# Patient Record
Sex: Female | Born: 1965
Health system: Southern US, Community
[De-identification: ages and names within clinical notes are randomized; demographics above are authoritative.]

## PROBLEM LIST (undated history)

## (undated) DIAGNOSIS — K219 Gastro-esophageal reflux disease without esophagitis: Secondary | ICD-10-CM

## (undated) HISTORY — PX: NO PAST SURGERIES: SHX2092

## (undated) HISTORY — DX: Gastro-esophageal reflux disease without esophagitis: K21.9

---

## 2006-06-03 ENCOUNTER — Ambulatory Visit: Payer: Self-pay | Admitting: Internal Medicine

## 2008-01-08 ENCOUNTER — Telehealth: Payer: Self-pay | Admitting: Internal Medicine

## 2008-01-10 ENCOUNTER — Ambulatory Visit: Payer: Self-pay | Admitting: Internal Medicine

## 2008-01-10 DIAGNOSIS — K219 Gastro-esophageal reflux disease without esophagitis: Secondary | ICD-10-CM | POA: Insufficient documentation

## 2008-01-10 DIAGNOSIS — R1013 Epigastric pain: Secondary | ICD-10-CM | POA: Insufficient documentation

## 2008-01-10 DIAGNOSIS — Z9189 Other specified personal risk factors, not elsewhere classified: Secondary | ICD-10-CM | POA: Insufficient documentation

## 2008-01-26 ENCOUNTER — Telehealth: Payer: Self-pay | Admitting: Internal Medicine

## 2008-02-09 ENCOUNTER — Encounter: Admission: RE | Admit: 2008-02-09 | Discharge: 2008-02-09 | Payer: Self-pay | Admitting: Obstetrics and Gynecology

## 2008-12-02 ENCOUNTER — Telehealth: Payer: Self-pay | Admitting: Internal Medicine

## 2009-02-25 ENCOUNTER — Telehealth: Payer: Self-pay | Admitting: Internal Medicine

## 2009-02-25 ENCOUNTER — Ambulatory Visit: Payer: Self-pay | Admitting: Internal Medicine

## 2009-02-25 DIAGNOSIS — J029 Acute pharyngitis, unspecified: Secondary | ICD-10-CM | POA: Insufficient documentation

## 2009-03-13 ENCOUNTER — Encounter: Admission: RE | Admit: 2009-03-13 | Discharge: 2009-03-13 | Payer: Self-pay | Admitting: Obstetrics and Gynecology

## 2009-10-28 IMAGING — MG MM SCREEN MAMMOGRAM BILATERAL
5 series · 5 of 5 positions shown · non-contrast
Comparison: none

DG SCREEN MAMMOGRAM BILATERAL
Bilateral CC and MLO view(s) were taken.

DIGITAL SCREENING MAMMOGRAM WITH CAD:
The breast tissue is heterogeneously dense.  No masses or malignant type calcifications are 
identified.  Compared with prior studies.

[R CC]
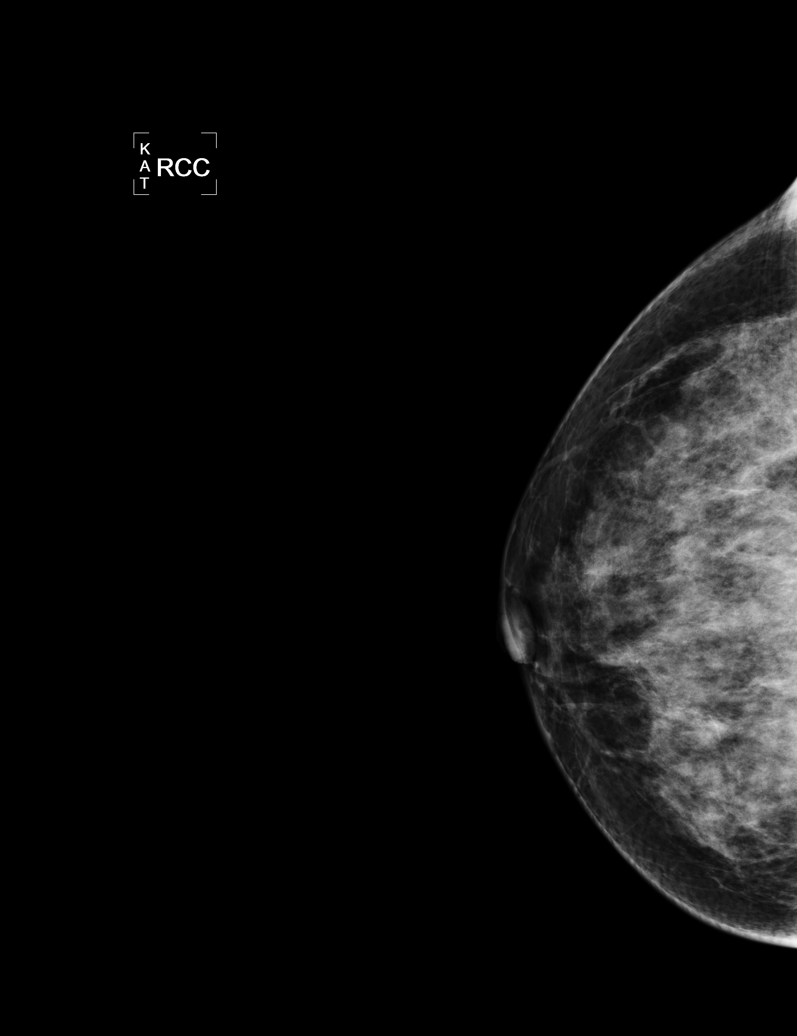

[L CC]
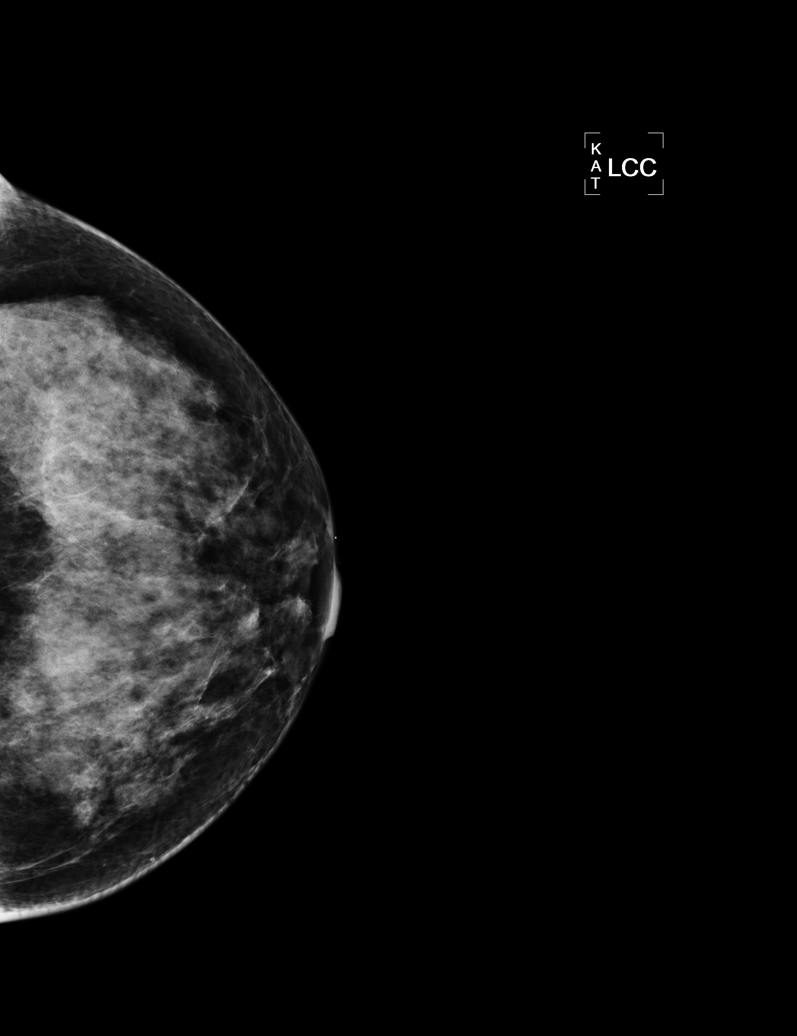

[L MLO]
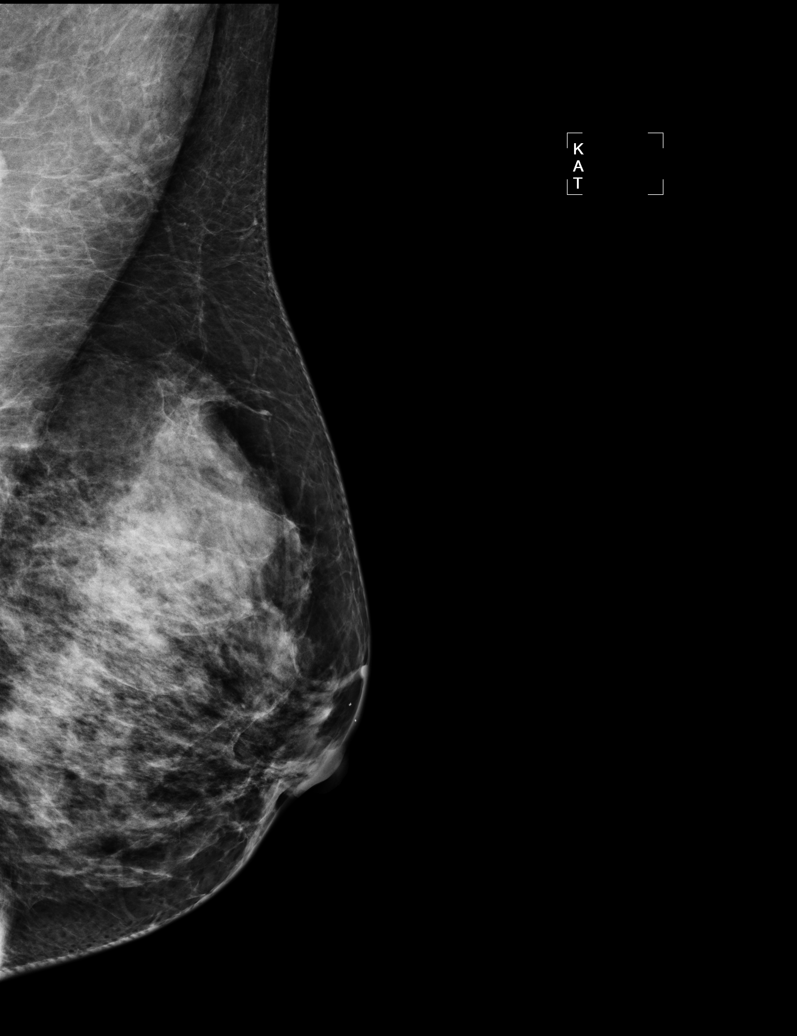

[R MLO (1 of 2)]
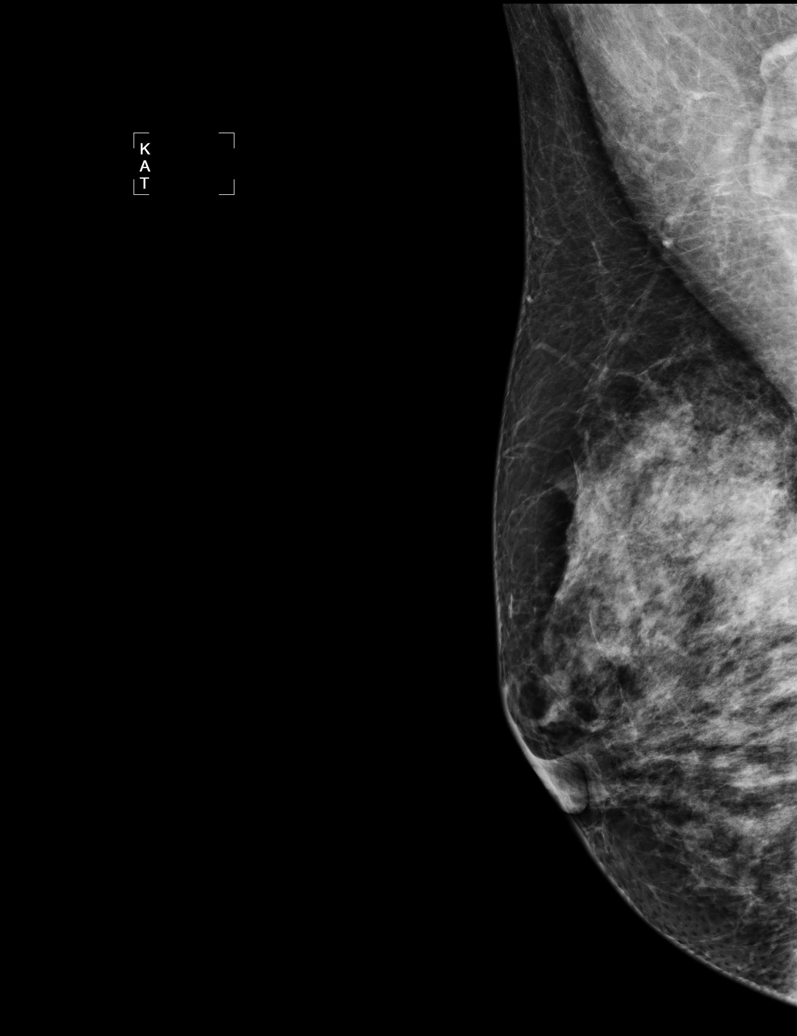

[R MLO (2 of 2)]
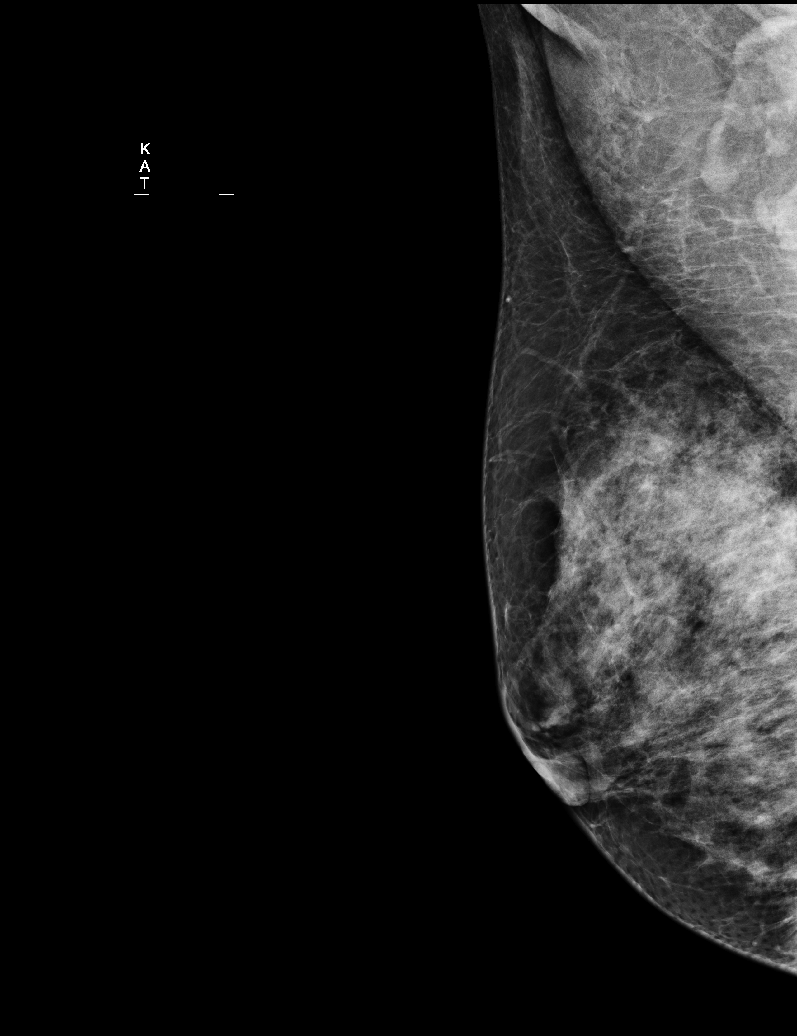

[5 of 5 positions shown; findings below may reference images not displayed]

IMPRESSION: No specific mammographic evidence of malignancy.  Next screening mammogram is recommended in one 
year.

A result letter of this screening mammogram will be mailed directly to the patient.

ASSESSMENT: Negative - BI-RADS 1

Screening mammogram in 1 year.
ANALYZED BY COMPUTER AIDED DETECTION. , THIS PROCEDURE WAS A DIGITAL MAMMOGRAM.

## 2010-04-06 ENCOUNTER — Encounter: Admission: RE | Admit: 2010-04-06 | Discharge: 2010-04-06 | Payer: Self-pay | Admitting: Obstetrics and Gynecology

## 2010-04-06 LAB — HM MAMMOGRAPHY

## 2010-12-08 ENCOUNTER — Telehealth (INDEPENDENT_AMBULATORY_CARE_PROVIDER_SITE_OTHER): Payer: Self-pay | Admitting: *Deleted

## 2011-01-07 NOTE — Progress Notes (Signed)
Summary: Request to be Worked in (cough and congestion)-please triage  Phone Note Call from Patient Call back at Work Phone [phone]   Summary of Call: cough and congestion x 3wks wants to be worked in today, if not today some time this week.(only sda available remainder of the week)  Initial call taken by: Trixie Dredge,  December 08, 2010 8:22 AM  Follow-up for Phone Call        [facility] Follow-up by: [facility] CMA AAMA,  December 08, 2010 1:55 PM  Additional Follow-up for Phone Call Additional follow up Details #1::        Phone not working. Additional Follow-up by: Lynann Beaver CMA AAMA,  December 09, 2010 1:49 PM

## 2011-03-18 ENCOUNTER — Other Ambulatory Visit: Payer: Self-pay | Admitting: Obstetrics and Gynecology

## 2011-03-18 DIAGNOSIS — Z1231 Encounter for screening mammogram for malignant neoplasm of breast: Secondary | ICD-10-CM

## 2011-04-20 ENCOUNTER — Ambulatory Visit
Admission: RE | Admit: 2011-04-20 | Discharge: 2011-04-20 | Disposition: A | Discharge status: Home / Self Care | Payer: Self-pay | Source: Ambulatory Visit | Attending: Obstetrics and Gynecology | Admitting: Obstetrics and Gynecology

## 2011-04-20 DIAGNOSIS — Z1231 Encounter for screening mammogram for malignant neoplasm of breast: Secondary | ICD-10-CM

## 2011-08-26 ENCOUNTER — Encounter: Payer: Self-pay | Admitting: Internal Medicine

## 2011-08-27 ENCOUNTER — Ambulatory Visit (INDEPENDENT_AMBULATORY_CARE_PROVIDER_SITE_OTHER): Payer: PRIVATE HEALTH INSURANCE | Admitting: Internal Medicine

## 2011-08-27 ENCOUNTER — Encounter: Payer: Self-pay | Admitting: Internal Medicine

## 2011-08-27 VITALS — BP 120/80 | HR 66 | Ht 66.5 in | Wt 157.0 lb

## 2011-08-27 DIAGNOSIS — R7982 Elevated C-reactive protein (CRP): Secondary | ICD-10-CM

## 2011-08-27 DIAGNOSIS — E611 Iron deficiency: Secondary | ICD-10-CM | POA: Insufficient documentation

## 2011-08-27 NOTE — Assessment & Plan Note (Signed)
   Iron deficiency ;no anemia  Test cause is most likely from menstrual loss increase foods rich in iron and supplements as tolerated. Was not alarming at this point no history of blood in stool should be followed up yearly at least.

## 2011-08-27 NOTE — Progress Notes (Signed)
Subjective:    Patient ID: Valerie Mcdowell, female    DOB: 12/12/1965, 45 y.o.   MRN: [id number]  HPI Patient comes in today for consultation about interpretation of laboratory studies that were done for screening through her regular health insurance.  She is generally well and is unhappy with her weight and states that she's gained weight over the last few years. She has no history of heart disease and does not smoke. However on the screening test it was noted that she had a consistent elevation of her cardio CRP over the last 3 years. 4.7 4.9 and 5.7. Her lipids were good with a total 143 and ratio 3.0 her iron saturation was 11 ferritin 24. Liver tests were normal and no CBC was done.   She is having periods although not very happy at this point in time. She thinks she might be perimenopausal. She has a gynecologist who she sees every year. Family history shows MMG and died after heart surgery one older and MG F. had a CVA in his 68s. Otherwise no history of vascular disease or sudden death   Review of Systems ROS:  GEN/ HEENTNo fever, significant weight changes sweats headaches vision problems hearing changes, CV/ PULM; No chest pain shortness of breath cough, syncope,edema  change in exercise tolerance. GI /GU: No adominal pain, vomiting, change in bowel habits. No blood in the stool. No significant GU symptoms. SKIN/HEME: ,no acute skin rashes suspicious lesions or bleeding. No lymphadenopathy, nodules, masses.  NEURO/ PSYCH:  No neurologic signs such as weakness numbness No depression anxiety. IMM/ Allergy: No unusual infections.  Allergy .   REST of 12 system review negative   Past history family history social history reviewed in the electronic medical record.     Objective:   Physical Exam Physical Exam: Vital signs reviewed BMW:UXLK is a well-developed well-nourished alert cooperative  white female who appears her stated age in no acute distress.  HEENT: normocephalic   traumatic , Eyes: PERRL EOM's full,  Grossly normal NECK: supple without masses, thyromegaly or bruits. CHEST/PULM:  Clear to auscultation and percussion breath sounds equal no wheeze , rales or rhonchi. No chest wall deformities or tenderness. CV: PMI is nondisplaced, S1 S2 no gallops, murmurs, rubs. Peripheral pulses are full without delay.No JVD .  ABDOMEN: Bowel sounds normal nontender  No guard or rebound, no hepato splenomegal no CVA tenderness.  No hernia. Extremtities:  No clubbing cyanosis or edema, no acute joint swelling or redness no focal atrophy NEURO:  Oriented x3, cranial nerves 3-12 appear to be intact, no obvious focal weakness,gait within normal limits no abnormal reflexes or asymmetrical SKIN: No acute rashes normal turgor, color, no bruising or petechiae. PSYCH: Oriented, good eye contact, no obvious depression anxiety, cognition and judgment appear normal.  Labs reviewed with patient. Lab Results  Component Value Date   HGB 12.6 08/27/2011       Assessment & Plan:    Elevated "Cardiac CRP"  On screening  4.7 ref range less than 3 . With generally normal cholesterol. No other risk factors. Her Framingham risk score is less than 1%  Discussed at length and certainty of how this marker should be used in clinical practice.  Her new studies in her age group about intervention. She has no obvious underlying cause sP isn't high enough to evaluate for inflammatory disease.  At this point in time intensive lifestyle intervention seems appropriate with Mediterranean diet and adequate exercise and maintaining a healthy body weight.  Discussed the limited data on  Medication intervention based on CRP in her age group.  Iron deficiency ;no anemia  Test cause is most likely from menstrual loss increase foods rich in iron and supplements as tolerated. Was not alarming at this point no history of blood in stool should be followed up yearly at least.  Total visit > 50% spent  counseling and coordinating care

## 2011-08-27 NOTE — Patient Instructions (Addendum)
Mediterranean diet  And exercise aerobic and resistance can help wieght and heart health. You do not have other risk factors at present. CRP is nonspecific othrewise.  You are iron deficient prob from  Periods.  Take MVI and extra ferrous sulfate  325 mg 1-2 x per day with periods  Increase iron rich foods .  3500 calories is the energy content of a pound of body weight .Must have a 3500 cal deficit to lose one pound . Thus decrease 500 calorie equivalent per day in food or drink intake / or exercise  for 7 days to lose one pound.  Your Framingham risk score is less than 1% risk over next  10 years .

## 2011-08-27 NOTE — Assessment & Plan Note (Signed)
See above

## 2011-12-23 ENCOUNTER — Ambulatory Visit (INDEPENDENT_AMBULATORY_CARE_PROVIDER_SITE_OTHER): Payer: BC Managed Care – PPO | Admitting: Family

## 2011-12-23 ENCOUNTER — Encounter: Payer: Self-pay | Admitting: Family

## 2011-12-23 VITALS — BP 112/78 | Temp 100.3°F | Wt 153.0 lb

## 2011-12-23 DIAGNOSIS — J069 Acute upper respiratory infection, unspecified: Secondary | ICD-10-CM

## 2011-12-23 DIAGNOSIS — R059 Cough, unspecified: Secondary | ICD-10-CM

## 2011-12-23 DIAGNOSIS — R05 Cough: Secondary | ICD-10-CM

## 2011-12-23 MED ORDER — AMOXICILLIN-POT CLAVULANATE 875-125 MG PO TABS: 1.0000 | ORAL_TABLET | Freq: Two times a day (BID) | ORAL | Status: AC

## 2011-12-23 NOTE — Patient Instructions (Signed)

## 2011-12-23 NOTE — Progress Notes (Signed)
  Subjective:    Patient ID: Valerie Mcdowell, female    DOB: Dec 07, 1965, 46 y.o.   MRN: [id number]  HPI Comments: C/o fever and chills, fatigue, muscle aches, sinus and nasal drainage.   Fever  Associated symptoms include coughing. Pertinent negatives include no congestion, ear pain or wheezing.  Sore Throat  Associated symptoms include coughing. Pertinent negatives include no congestion, ear discharge, ear pain or shortness of breath.  Cough Associated symptoms include chills, postnasal drip and rhinorrhea. Pertinent negatives include no ear pain, shortness of breath or wheezing.      Review of Systems  Constitutional: Positive for chills, appetite change and fatigue.  HENT: Positive for rhinorrhea, postnasal drip and sinus pressure. Negative for ear pain, congestion, sneezing and ear discharge.   Eyes: Negative for discharge and itching.  Respiratory: Positive for cough. Negative for apnea, chest tightness, shortness of breath and wheezing.   Cardiovascular: Negative.   Neurological: Negative.    Past Medical History  Diagnosis Date  . GERD (gastroesophageal reflux disease)     History   Social History  . Marital Status: Married    Spouse Name: N/A    Number of Children: N/A  . Years of Education: N/A   Occupational History  . Not on file.   Social History Main Topics  . Smoking status: Never Smoker   . Smokeless tobacco: Not on file  . Alcohol Use: Yes     Occ.  . Drug Use: No  . Sexually Active: Not on file   Other Topics Concern  . 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 No past surgeries     Family History  Problem Relation Age of Onset  . Diabetes      grandparents  . Hypertension      grandparents and other blood relative  . Heart disease      grandparents    Allergies  Allergen Reactions  . Latex     No current outpatient  prescriptions on file prior to visit.    BP 112/78  Temp(Src) 100.3 F (37.9 C) (Oral)  Wt 153 lb (69.4 kg)chart    Objective:   Physical Exam  Constitutional: She is oriented to person, place, and time. She appears well-developed.  Cardiovascular: Normal rate, regular rhythm, normal heart sounds and intact distal pulses.  Exam reveals no gallop and no friction rub.   No murmur heard. Pulmonary/Chest: Effort normal and breath sounds normal. No respiratory distress. She has no wheezes. She exhibits no tenderness.  Neurological: She is alert and oriented to person, place, and time.  Skin: Skin is warm and dry.  Psychiatric: She has a normal mood and affect. Her behavior is normal.          Assessment & Plan:  Assessment: URI Plan: Rest, increase po fluids, augmentin, otc mucinex. If s/s worsen or do not subside in one week rtc. Education print out provided for URI and opportunity for questions provided

## 2012-01-26 ENCOUNTER — Encounter: Payer: Self-pay | Admitting: Family Medicine

## 2012-01-26 ENCOUNTER — Ambulatory Visit (INDEPENDENT_AMBULATORY_CARE_PROVIDER_SITE_OTHER): Payer: BC Managed Care – PPO | Admitting: Family Medicine

## 2012-01-26 VITALS — BP 130/82 | Temp 98.4°F | Wt 156.0 lb

## 2012-01-26 DIAGNOSIS — R059 Cough, unspecified: Secondary | ICD-10-CM

## 2012-01-26 DIAGNOSIS — K219 Gastro-esophageal reflux disease without esophagitis: Secondary | ICD-10-CM

## 2012-01-26 DIAGNOSIS — R05 Cough: Secondary | ICD-10-CM

## 2012-01-26 MED ORDER — AZITHROMYCIN 250 MG PO TABS: ORAL_TABLET | ORAL | Status: AC

## 2012-01-26 MED ORDER — BENZONATATE 200 MG PO CAPS: 200.0000 mg | ORAL_CAPSULE | Freq: Three times a day (TID) | ORAL | Status: AC | PRN

## 2012-01-26 NOTE — Patient Instructions (Signed)
Hold mentholated cough drops. Continue antacid such as Prilosec. Touch base by next week if no better.

## 2012-01-26 NOTE — Progress Notes (Signed)
  Subjective:    Patient ID: Valerie Mcdowell, female    DOB: 30-Apr-1966, 46 y.o.   MRN: [id number]  HPI  Patient is a nonsmoker who is seen with some intermittent persistent cough. Seen about one month ago and treated with Augmentin. Seemed to improve slightly. Her cough is somewhat intermittent. She denies any documented fevers but has had occasional sweats and chills off and on over the past few weeks. Denies any major sinus congestion or nasal discharge. No sore throat. No history of asthma. No pleuritic pain. No dyspnea with exertion. She is using some mentholated cough drops. Question of some GERD symptoms with occasional burning substernal the past week. She has taken some Zantac and scaled back things like wine consumption. No exertional chest pain.  Patient's husband with somewhat similar symptoms currently being treated. Otherwise no ill exposures though she did travel some recently with flight.  Past Medical History  Diagnosis Date  . GERD (gastroesophageal reflux disease)    Past Surgical History  Procedure Date  . No past surgeries     reports that she has never smoked. She does not have any smokeless tobacco history on file. She reports that she drinks alcohol. She reports that she does not use illicit drugs. family history includes Diabetes in an unspecified family member; Heart disease in an unspecified family member; and Hypertension in an unspecified family member. Allergies  Allergen Reactions  . Latex   '   Review of Systems  Constitutional: Positive for chills. Negative for fever.  HENT: Negative for ear pain and congestion.   Respiratory: Positive for cough. Negative for shortness of breath.   Cardiovascular: Negative for chest pain.  Neurological: Negative for headaches.  Hematological: Negative for adenopathy.       Objective:   Physical Exam  Constitutional: She appears well-developed and well-nourished.  HENT:  Right Ear: External ear normal.  Left Ear:  External ear normal.  Mouth/Throat: Oropharynx is clear and moist.  Neck: Neck supple. No thyromegaly present.  Cardiovascular: Normal rate and regular rhythm.   Pulmonary/Chest: Effort normal and breath sounds normal. No respiratory distress. She has no wheezes. She has no rales.  Lymphadenopathy:    She has no cervical adenopathy.          Assessment & Plan:  Persistent cough. Differential is post viral bronchitis, atypical infection such as mycoplasma, or GERD related cough. No evidence for reactive airway disease. No postnasal drip symptoms. Doubt chronic sinusitis. Start Zithromax. Avoid mentholated cough drops. Try over-the-counter Prilosec. Consider chest x-ray if not improving by next week

## 2012-03-14 ENCOUNTER — Other Ambulatory Visit: Payer: Self-pay | Admitting: Obstetrics and Gynecology

## 2012-03-14 DIAGNOSIS — Z1231 Encounter for screening mammogram for malignant neoplasm of breast: Secondary | ICD-10-CM

## 2012-04-25 ENCOUNTER — Ambulatory Visit
Admission: RE | Admit: 2012-04-25 | Discharge: 2012-04-25 | Disposition: A | Discharge status: Home / Self Care | Payer: BC Managed Care – PPO | Source: Ambulatory Visit | Attending: Obstetrics and Gynecology | Admitting: Obstetrics and Gynecology

## 2012-04-25 ENCOUNTER — Ambulatory Visit: Payer: BC Managed Care – PPO

## 2012-04-25 DIAGNOSIS — Z1231 Encounter for screening mammogram for malignant neoplasm of breast: Secondary | ICD-10-CM

## 2013-03-30 ENCOUNTER — Other Ambulatory Visit: Payer: Self-pay

## 2013-03-30 DIAGNOSIS — Z1231 Encounter for screening mammogram for malignant neoplasm of breast: Secondary | ICD-10-CM

## 2013-05-01 ENCOUNTER — Ambulatory Visit
Admission: RE | Admit: 2013-05-01 | Discharge: 2013-05-01 | Disposition: A | Discharge status: Home / Self Care | Payer: BC Managed Care – PPO | Source: Ambulatory Visit

## 2013-05-01 DIAGNOSIS — Z1231 Encounter for screening mammogram for malignant neoplasm of breast: Secondary | ICD-10-CM

## 2013-07-31 ENCOUNTER — Encounter: Payer: BC Managed Care – PPO | Admitting: Internal Medicine

## 2013-10-24 ENCOUNTER — Encounter: Payer: Self-pay | Admitting: Internal Medicine

## 2013-10-24 ENCOUNTER — Ambulatory Visit (INDEPENDENT_AMBULATORY_CARE_PROVIDER_SITE_OTHER): Payer: BC Managed Care – PPO | Admitting: Internal Medicine

## 2013-10-24 VITALS — BP 114/72 | HR 90 | Temp 97.5°F | Ht 66.0 in | Wt 156.0 lb

## 2013-10-24 DIAGNOSIS — Z0000 Encounter for general adult medical examination without abnormal findings: Secondary | ICD-10-CM

## 2013-10-24 DIAGNOSIS — Z23 Encounter for immunization: Secondary | ICD-10-CM

## 2013-10-24 DIAGNOSIS — R7982 Elevated C-reactive protein (CRP): Secondary | ICD-10-CM

## 2013-10-24 NOTE — Patient Instructions (Addendum)
Continue lifestyle intervention healthy eating and exercise . 150 minutes of exercise weeks  ,   weight  To healthy levels. Avoid trans fats and processed foods;  Increase fresh fruits and veges to 5 servings per day. And avoid sweet beverages  Including tea and juice. crp is a non specific inflammation marker and is non specific . If is a marker for elevated risk fro populations  Not  A given person.   Mediterranean diet  Is heart healthy .   Preventive Care for Adults, Female A healthy lifestyle and preventive care can promote health and wellness. Preventive health guidelines for women include the following key practices.  A routine yearly physical is a good way to check with your caregiver about your health and preventive screening. It is a chance to share any concerns and updates on your health, and to receive a thorough exam.  Visit your dentist for a routine exam and preventive care every 6 months. Brush your teeth twice a day and floss once a day. Good oral hygiene prevents tooth decay and gum disease.  The frequency of eye exams is based on your age, health, family medical history, use of contact lenses, and other factors. Follow your caregiver's recommendations for frequency of eye exams.  Eat a healthy diet. Foods like vegetables, fruits, whole grains, low-fat dairy products, and lean protein foods contain the nutrients you need without too many calories. Decrease your intake of foods high in solid fats, added sugars, and salt. Eat the right amount of calories for you.Get information about a proper diet from your caregiver, if necessary.  Regular physical exercise is one of the most important things you can do for your health. Most adults should get at least 150 minutes of moderate-intensity exercise (any activity that increases your heart rate and causes you to sweat) each week. In addition, most adults need muscle-strengthening exercises on 2 or more days a week.  Maintain a healthy  weight. The body mass index (BMI) is a screening tool to identify possible weight problems. It provides an estimate of body fat based on height and weight. Your caregiver can help determine your BMI, and can help you achieve or maintain a healthy weight.For adults 20 years and older:  A BMI below 18.5 is considered underweight.  A BMI of 18.5 to 24.9 is normal.  A BMI of 25 to 29.9 is considered overweight.  A BMI of 30 and above is considered obese.  Maintain normal blood lipids and cholesterol levels by exercising and minimizing your intake of saturated fat. Eat a balanced diet with plenty of fruit and vegetables. Blood tests for lipids and cholesterol should begin at age 68 and be repeated every 5 years. If your lipid or cholesterol levels are high, you are over 50, or you are at high risk for heart disease, you may need your cholesterol levels checked more frequently.Ongoing high lipid and cholesterol levels should be treated with medicines if diet and exercise are not effective.  If you smoke, find out from your caregiver how to quit. If you do not use tobacco, do not start.  Lung cancer screening is recommended for adults aged 24 80 years who are at high risk for developing lung cancer because of a history of smoking. Yearly low-dose computed tomography (CT) is recommended for people who have at least a 30-pack-year history of smoking and are a current smoker or have quit within the past 15 years. A pack year of smoking is smoking an average  of 1 pack of cigarettes a day for 1 year (for example: 1 pack a day for 30 years or 2 packs a day for 15 years). Yearly screening should continue until the smoker has stopped smoking for at least 15 years. Yearly screening should also be stopped for people who develop a health problem that would prevent them from having lung cancer treatment.  If you are pregnant, do not drink alcohol. If you are breastfeeding, be very cautious about drinking alcohol. If  you are not pregnant and choose to drink alcohol, do not exceed 1 drink per day. One drink is considered to be 12 ounces (355 mL) of beer, 5 ounces (148 mL) of wine, or 1.5 ounces (44 mL) of liquor.  Avoid use of street drugs. Do not share needles with anyone. Ask for help if you need support or instructions about stopping the use of drugs.  High blood pressure causes heart disease and increases the risk of stroke. Your blood pressure should be checked at least every 1 to 2 years. Ongoing high blood pressure should be treated with medicines if weight loss and exercise are not effective.  If you are 80 to 48 years old, ask your caregiver if you should take aspirin to prevent strokes.  Diabetes screening involves taking a blood sample to check your fasting blood sugar level. This should be done once every 3 years, after age 38, if you are within normal weight and without risk factors for diabetes. Testing should be considered at a younger age or be carried out more frequently if you are overweight and have at least 1 risk factor for diabetes.  Breast cancer screening is essential preventive care for women. You should practice "breast self-awareness." This means understanding the normal appearance and feel of your breasts and may include breast self-examination. Any changes detected, no matter how small, should be reported to a caregiver. Women in their 70s and 30s should have a clinical breast exam (CBE) by a caregiver as part of a regular health exam every 1 to 3 years. After age 31, women should have a CBE every year. Starting at age 66, women should consider having a mammography (breast X-ray test) every year. Women who have a family history of breast cancer should talk to their caregiver about genetic screening. Women at a high risk of breast cancer should talk to their caregivers about having magnetic resonance imaging (MRI) and a mammography every year.  Breast cancer gene (BRCA)-related cancer risk  assessment is recommended for women who have family members with BRCA-related cancers. BRCA-related cancers include breast, ovarian, tubal, and peritoneal cancers. Having family members with these cancers may be associated with an increased risk for harmful changes (mutations) in the breast cancer genes BRCA1 and BRCA2. Results of the assessment will determine the need for genetic counseling and BRCA1 and BRCA2 testing.  The Pap test is a screening test for cervical cancer. A Pap test can show cell changes on the cervix that might become cervical cancer if left untreated. A Pap test is a procedure in which cells are obtained and examined from the lower end of the uterus (cervix).  Women should have a Pap test starting at age 39.  Between ages 84 and 68, Pap tests should be repeated every 2 years.  Beginning at age 26, you should have a Pap test every 3 years as long as the past 3 Pap tests have been normal.  Some women have medical problems that increase the chance of  getting cervical cancer. Talk to your caregiver about these problems. It is especially important to talk to your caregiver if a new problem develops soon after your last Pap test. In these cases, your caregiver may recommend more frequent screening and Pap tests.  The above recommendations are the same for women who have or have not gotten the vaccine for human papillomavirus (HPV).  If you had a hysterectomy for a problem that was not cancer or a condition that could lead to cancer, then you no longer need Pap tests. Even if you no longer need a Pap test, a regular exam is a good idea to make sure no other problems are starting.  If you are between ages 12 and 10, and you have had normal Pap tests going back 10 years, you no longer need Pap tests. Even if you no longer need a Pap test, a regular exam is a good idea to make sure no other problems are starting.  If you have had past treatment for cervical cancer or a condition that  could lead to cancer, you need Pap tests and screening for cancer for at least 20 years after your treatment.  If Pap tests have been discontinued, risk factors (such as a new sexual partner) need to be reassessed to determine if screening should be resumed.  The HPV test is an additional test that may be used for cervical cancer screening. The HPV test looks for the virus that can cause the cell changes on the cervix. The cells collected during the Pap test can be tested for HPV. The HPV test could be used to screen women aged 60 years and older, and should be used in women of any age who have unclear Pap test results. After the age of 17, women should have HPV testing at the same frequency as a Pap test.  Colorectal cancer can be detected and often prevented. Most routine colorectal cancer screening begins at the age of 63 and continues through age 25. However, your caregiver may recommend screening at an earlier age if you have risk factors for colon cancer. On a yearly basis, your caregiver may provide home test kits to check for hidden blood in the stool. Use of a small camera at the end of a tube, to directly examine the colon (sigmoidoscopy or colonoscopy), can detect the earliest forms of colorectal cancer. Talk to your caregiver about this at age 49, when routine screening begins. Direct examination of the colon should be repeated every 5 to 10 years through age 21, unless early forms of pre-cancerous polyps or small growths are found.  Hepatitis C blood testing is recommended for all people born from [birth year] through 1966 and any individual with known risks for hepatitis C.  Practice safe sex. Use condoms and avoid high-risk sexual practices to reduce the spread of sexually transmitted infections (STIs). STIs include gonorrhea, chlamydia, syphilis, trichomonas, herpes, HPV, and human immunodeficiency virus (HIV). Herpes, HIV, and HPV are viral illnesses that have no cure. They can result in  disability, cancer, and death. Sexually active women aged 58 and younger should be checked for chlamydia. Older women with new or multiple partners should also be tested for chlamydia. Testing for other STIs is recommended if you are sexually active and at increased risk.  Osteoporosis is a disease in which the bones lose minerals and strength with aging. This can result in serious bone fractures. The risk of osteoporosis can be identified using a bone density scan. Women ages  65 and over and women at risk for fractures or osteoporosis should discuss screening with their caregivers. Ask your caregiver whether you should take a calcium supplement or vitamin D to reduce the rate of osteoporosis.  Menopause can be associated with physical symptoms and risks. Hormone replacement therapy is available to decrease symptoms and risks. You should talk to your caregiver about whether hormone replacement therapy is right for you.  Use sunscreen. Apply sunscreen liberally and repeatedly throughout the day. You should seek shade when your shadow is shorter than you. Protect yourself by wearing long sleeves, pants, a wide-brimmed hat, and sunglasses year round, whenever you are outdoors.  Once a month, do a whole body skin exam, using a mirror to look at the skin on your back. Notify your caregiver of new moles, moles that have irregular borders, moles that are larger than a pencil eraser, or moles that have changed in shape or color.  Stay current with required immunizations.  Influenza vaccine. All adults should be immunized every year.  Tetanus, diphtheria, and acellular pertussis (Td, Tdap) vaccine. Pregnant women should receive 1 dose of Tdap vaccine during each pregnancy. The dose should be obtained regardless of the length of time since the last dose. Immunization is preferred during the 27th to 36th week of gestation. An adult who has not previously received Tdap or who does not know her vaccine status  should receive 1 dose of Tdap. This initial dose should be followed by tetanus and diphtheria toxoids (Td) booster doses every 10 years. Adults with an unknown or incomplete history of completing a 3-dose immunization series with Td-containing vaccines should begin or complete a primary immunization series including a Tdap dose. Adults should receive a Td booster every 10 years.  Varicella vaccine. An adult without evidence of immunity to varicella should receive 2 doses or a second dose if she has previously received 1 dose. Pregnant females who do not have evidence of immunity should receive the first dose after pregnancy. This first dose should be obtained before leaving the health care facility. The second dose should be obtained 4 8 weeks after the first dose.  Human papillomavirus (HPV) vaccine. Females aged 58 26 years who have not received the vaccine previously should obtain the 3-dose series. The vaccine is not recommended for use in pregnant females. However, pregnancy testing is not needed before receiving a dose. If a female is found to be pregnant after receiving a dose, no treatment is needed. In that case, the remaining doses should be delayed until after the pregnancy. Immunization is recommended for any person with an immunocompromised condition through the age of 26 years if she did not get any or all doses earlier. During the 3-dose series, the second dose should be obtained 4 8 weeks after the first dose. The third dose should be obtained 24 weeks after the first dose and 16 weeks after the second dose.  Zoster vaccine. One dose is recommended for adults aged 24 years or older unless certain conditions are present.  Measles, mumps, and rubella (MMR) vaccine. Adults born before [birth year] generally are considered immune to measles and mumps. Adults born in [birth year] or later should have 1 or more doses of MMR vaccine unless there is a contraindication to the vaccine or there is laboratory evidence  of immunity to each of the three diseases. A routine second dose of MMR vaccine should be obtained at least 28 days after the first dose for students attending postsecondary schools, health care  workers, or international travelers. People who received inactivated measles vaccine or an unknown type of measles vaccine during 1963 1967 should receive 2 doses of MMR vaccine. People who received inactivated mumps vaccine or an unknown type of mumps vaccine before 1979 and are at high risk for mumps infection should consider immunization with 2 doses of MMR vaccine. For females of childbearing age, rubella immunity should be determined. If there is no evidence of immunity, females who are not pregnant should be vaccinated. If there is no evidence of immunity, females who are pregnant should delay immunization until after pregnancy. Unvaccinated health care workers born before [birth year] who lack laboratory evidence of measles, mumps, or rubella immunity or laboratory confirmation of disease should consider measles and mumps immunization with 2 doses of MMR vaccine or rubella immunization with 1 dose of MMR vaccine.  Pneumococcal 13-valent conjugate (PCV13) vaccine. When indicated, a person who is uncertain of her immunization history and has no record of immunization should receive the PCV13 vaccine. An adult aged 58 years or older who has certain medical conditions and has not been previously immunized should receive 1 dose of PCV13 vaccine. This PCV13 should be followed with a dose of pneumococcal polysaccharide (PPSV23) vaccine. The PPSV23 vaccine dose should be obtained at least 8 weeks after the dose of PCV13 vaccine. An adult aged 57 years or older who has certain medical conditions and previously received 1 or more doses of PPSV23 vaccine should receive 1 dose of PCV13. The PCV13 vaccine dose should be obtained 1 or more years after the last PPSV23 vaccine dose.  Pneumococcal polysaccharide (PPSV23) vaccine. When  PCV13 is also indicated, PCV13 should be obtained first. All adults aged 6 years and older should be immunized. An adult younger than age 76 years who has certain medical conditions should be immunized. Any person who resides in a nursing home or long-term care facility should be immunized. An adult smoker should be immunized. People with an immunocompromised condition and certain other conditions should receive both PCV13 and PPSV23 vaccines. People with human immunodeficiency virus (HIV) infection should be immunized as soon as possible after diagnosis. Immunization during chemotherapy or radiation therapy should be avoided. Routine use of PPSV23 vaccine is not recommended for American Indians, [address], or people younger than 65 years unless there are medical conditions that require PPSV23 vaccine. When indicated, people who have unknown immunization and have no record of immunization should receive PPSV23 vaccine. One-time revaccination 5 years after the first dose of PPSV23 is recommended for people aged 82 64 years who have chronic kidney failure, nephrotic syndrome, asplenia, or immunocompromised conditions. People who received 1 2 doses of PPSV23 before age 65 years should receive another dose of PPSV23 vaccine at age 42 years or later if at least 5 years have passed since the previous dose. Doses of PPSV23 are not needed for people immunized with PPSV23 at or after age 67 years.  Meningococcal vaccine. Adults with asplenia or persistent complement component deficiencies should receive 2 doses of quadrivalent meningococcal conjugate (MenACWY-D) vaccine. The doses should be obtained at least 2 months apart. Microbiologists working with certain meningococcal bacteria, military recruits, people at risk during an outbreak, and people who travel to or live in countries with a high rate of meningitis should be immunized. A first-year college student up through age 17 years who is living in a residence  hall should receive a dose if she did not receive a dose on or after her 16th birthday. Adults  who have certain high-risk conditions should receive one or more doses of vaccine.  Hepatitis A vaccine. Adults who wish to be protected from this disease, have certain high-risk conditions, work with hepatitis A-infected animals, work in hepatitis A research labs, or travel to or work in countries with a high rate of hepatitis A should be immunized. Adults who were previously unvaccinated and who anticipate close contact with an international adoptee during the first 60 days after arrival in the [place] from a country with a high rate of hepatitis A should be immunized.  Hepatitis B vaccine. Adults who wish to be protected from this disease, have certain high-risk conditions, may be exposed to blood or other infectious body fluids, are household contacts or sex partners of hepatitis B positive people, are clients or workers in certain care facilities, or travel to or work in countries with a high rate of hepatitis B should be immunized.  Haemophilus influenzae type b (Hib) vaccine. A previously unvaccinated person with asplenia or sickle cell disease or having a scheduled splenectomy should receive 1 dose of Hib vaccine. Regardless of previous immunization, a recipient of a hematopoietic stem cell transplant should receive a 3-dose series 6 12 months after her successful transplant. Hib vaccine is not recommended for adults with HIV infection. Preventive Services / Frequency Ages 46 to 54  Blood pressure check.** / Every 1 to 2 years.  Lipid and cholesterol check.** / Every 5 years beginning at age 56.  Clinical breast exam.** / Every 3 years for women in their 49s and 30s.  BRCA-related cancer risk assessment.** / For women who have family members with a BRCA-related cancer (breast, ovarian, tubal, or peritoneal cancers).  Pap test.** / Every 2 years from ages 38 through 48. Every 3 years starting  at age 4 through age 57 or 87 with a history of 3 consecutive normal Pap tests.  HPV screening.** / Every 3 years from ages 70 through ages 27 to 65 with a history of 3 consecutive normal Pap tests.  Hepatitis C blood test.** / For any individual with known risks for hepatitis C.  Skin self-exam. / Monthly.  Influenza vaccine. / Every year.  Tetanus, diphtheria, and acellular pertussis (Tdap, Td) vaccine.** / Consult your caregiver. Pregnant women should receive 1 dose of Tdap vaccine during each pregnancy. 1 dose of Td every 10 years.  Varicella vaccine.** / Consult your caregiver. Pregnant females who do not have evidence of immunity should receive the first dose after pregnancy.  HPV vaccine. / 3 doses over 6 months, if 26 and younger. The vaccine is not recommended for use in pregnant females. However, pregnancy testing is not needed before receiving a dose.  Measles, mumps, rubella (MMR) vaccine.** / You need at least 1 dose of MMR if you were born in 1957 or later. You may also need a 2nd dose. For females of childbearing age, rubella immunity should be determined. If there is no evidence of immunity, females who are not pregnant should be vaccinated. If there is no evidence of immunity, females who are pregnant should delay immunization until after pregnancy.  Pneumococcal 13-valent conjugate (PCV13) vaccine.** / Consult your caregiver.  Pneumococcal polysaccharide (PPSV23) vaccine.** / 1 to 2 doses if you smoke cigarettes or if you have certain conditions.  Meningococcal vaccine.** / 1 dose if you are age 30 to 21 years and a Orthoptist living in a residence hall, or have one of several medical conditions, you need to get vaccinated  against meningococcal disease. You may also need additional booster doses.  Hepatitis A vaccine.** / Consult your caregiver.  Hepatitis B vaccine.** / Consult your caregiver.  Haemophilus influenzae type b (Hib) vaccine.** / Consult  your caregiver. Ages 7 to 59  Blood pressure check.** / Every 1 to 2 years.  Lipid and cholesterol check.** / Every 5 years beginning at age 81.  Lung cancer screening. / Every year if you are aged 52 80 years and have a 30-pack-year history of smoking and currently smoke or have quit within the past 15 years. Yearly screening is stopped once you have quit smoking for at least 15 years or develop a health problem that would prevent you from having lung cancer treatment.  Clinical breast exam.** / Every year after age 30.  BRCA-related cancer risk assessment.** / For women who have family members with a BRCA-related cancer (breast, ovarian, tubal, or peritoneal cancers).  Mammogram.** / Every year beginning at age 84 and continuing for as long as you are in good health. Consult with your caregiver.  Pap test.** / Every 3 years starting at age 3 through age 40 or 76 with a history of 3 consecutive normal Pap tests.  HPV screening.** / Every 3 years from ages 34 through ages 65 to 71 with a history of 3 consecutive normal Pap tests.  Fecal occult blood test (FOBT) of stool. / Every year beginning at age 5 and continuing until age 39. You may not need to do this test if you get a colonoscopy every 10 years.  Flexible sigmoidoscopy or colonoscopy.** / Every 5 years for a flexible sigmoidoscopy or every 10 years for a colonoscopy beginning at age 47 and continuing until age 47.  Hepatitis C blood test.** / For all people born from [birth year] through 1966 and any individual with known risks for hepatitis C.  Skin self-exam. / Monthly.  Influenza vaccine. / Every year.  Tetanus, diphtheria, and acellular pertussis (Tdap/Td) vaccine.** / Consult your caregiver. Pregnant women should receive 1 dose of Tdap vaccine during each pregnancy. 1 dose of Td every 10 years.  Varicella vaccine.** / Consult your caregiver. Pregnant females who do not have evidence of immunity should receive the first dose  after pregnancy.  Zoster vaccine.** / 1 dose for adults aged 53 years or older.  Measles, mumps, rubella (MMR) vaccine.** / You need at least 1 dose of MMR if you were born in 1957 or later. You may also need a 2nd dose. For females of childbearing age, rubella immunity should be determined. If there is no evidence of immunity, females who are not pregnant should be vaccinated. If there is no evidence of immunity, females who are pregnant should delay immunization until after pregnancy.  Pneumococcal 13-valent conjugate (PCV13) vaccine.** / Consult your caregiver.  Pneumococcal polysaccharide (PPSV23) vaccine.** / 1 to 2 doses if you smoke cigarettes or if you have certain conditions.  Meningococcal vaccine.** / Consult your caregiver.  Hepatitis A vaccine.** / Consult your caregiver.  Hepatitis B vaccine.** / Consult your caregiver.  Haemophilus influenzae type b (Hib) vaccine.** / Consult your caregiver. Ages 81 and over  Blood pressure check.** / Every 1 to 2 years.  Lipid and cholesterol check.** / Every 5 years beginning at age 68.  Lung cancer screening. / Every year if you are aged 59 80 years and have a 30-pack-year history of smoking and currently smoke or have quit within the past 15 years. Yearly screening is stopped once you have quit  smoking for at least 15 years or develop a health problem that would prevent you from having lung cancer treatment.  Clinical breast exam.** / Every year after age 74.  BRCA-related cancer risk assessment.** / For women who have family members with a BRCA-related cancer (breast, ovarian, tubal, or peritoneal cancers).  Mammogram.** / Every year beginning at age 68 and continuing for as long as you are in good health. Consult with your caregiver.  Pap test.** / Every 3 years starting at age 59 through age 70 or 26 with a 3 consecutive normal Pap tests. Testing can be stopped between 65 and 70 with 3 consecutive normal Pap tests and no abnormal  Pap or HPV tests in the past 10 years.  HPV screening.** / Every 3 years from ages 59 through ages 46 or 28 with a history of 3 consecutive normal Pap tests. Testing can be stopped between 65 and 70 with 3 consecutive normal Pap tests and no abnormal Pap or HPV tests in the past 10 years.  Fecal occult blood test (FOBT) of stool. / Every year beginning at age 46 and continuing until age 59. You may not need to do this test if you get a colonoscopy every 10 years.  Flexible sigmoidoscopy or colonoscopy.** / Every 5 years for a flexible sigmoidoscopy or every 10 years for a colonoscopy beginning at age 27 and continuing until age 99.  Hepatitis C blood test.** / For all people born from [birth year] through 1966 and any individual with known risks for hepatitis C.  Osteoporosis screening.** / A one-time screening for women ages 83 and over and women at risk for fractures or osteoporosis.  Skin self-exam. / Monthly.  Influenza vaccine. / Every year.  Tetanus, diphtheria, and acellular pertussis (Tdap/Td) vaccine.** / 1 dose of Td every 10 years.  Varicella vaccine.** / Consult your caregiver.  Zoster vaccine.** / 1 dose for adults aged 31 years or older.  Pneumococcal 13-valent conjugate (PCV13) vaccine.** / Consult your caregiver.  Pneumococcal polysaccharide (PPSV23) vaccine.** / 1 dose for all adults aged 16 years and older.  Meningococcal vaccine.** / Consult your caregiver.  Hepatitis A vaccine.** / Consult your caregiver.  Hepatitis B vaccine.** / Consult your caregiver.  Haemophilus influenzae type b (Hib) vaccine.** / Consult your caregiver. ** Family history and personal history of risk and conditions may change your caregiver's recommendations. Document Released: 01/18/2002 Document Revised: 03/19/2013 Document Reviewed: 04/19/2011 [facility] Patient Information 2014 [place], [place].

## 2013-10-24 NOTE — Progress Notes (Signed)
Chief Complaint  Patient presents with  . Annual Exam    HPI: Patient comes in today for Preventive Health Care visit  Last ov was early 2013  Neuropathy.  In mom  ? From sjogrens  . ? About how to avoid this.  No diabetes  Periods normal  Reg  Heavy .  working 32 and 40 hours  No cv pulm sx  Screening at work  All good except elevated crp 6 range  Has hx  Of mild elevated 2 years ago and normal last year  No chronic meds take vits d and b  Neg fam hx fo premature vascular disease  ROS:  GEN/ HEENT: No fever, significant weight changes sweats headaches vision problems hearing changes, CV/ PULM; No chest pain shortness of breath cough, syncope,edema  change in exercise tolerance. GI /GU: No adominal pain, vomiting, change in bowel habits. No blood in the stool. No significant GU symptoms. SKIN/HEME: ,no acute skin rashes suspicious lesions or bleeding. No lymphadenopathy, nodules, masses.  NEURO/ PSYCH:  No neurologic signs such as weakness numbness. No depression anxiety. IMM/ Allergy: No unusual infections.  Allergy .   REST of 12 system review negative except as per HPI   Past Medical History  Diagnosis Date  . GERD (gastroesophageal reflux disease)     Family History  Problem Relation Age of Onset  . Diabetes      grandparents  . Hypertension      grandparents and other blood relative  . Heart disease      grandparents  . Neuropathy Mother   . Sjogren's syndrome Mother     History   Social History  . Marital Status: Married    Spouse Name: N/A    Number of Children: N/A  . Years of Education: N/A   Social History Main Topics  . Smoking status: Never Smoker   . Smokeless tobacco: None  . Alcohol Use: Yes     Comment: Occ.  . Drug Use: No  . Sexual Activity: None   Other Topics Concern  . None   Social History Narrative   Occupation: Psychologist, sport and exercise clinical lead person    Married   HH of 4   6-8 hours of sleep   Neg tobacco    etoh 5 per  week.   caffene   No sugars  Tea.     Outpatient Encounter Prescriptions as of 10/24/2013  Medication Sig  . B Complex-C (SUPER B COMPLEX PO) Take by mouth.  . Cholecalciferol (VITAMIN D PO) Take by mouth.  . Nutritional Supplements (JUICE PLUS FIBRE PO) Take by mouth.    EXAM:  BP 114/72  Pulse 90  Temp(Src) 97.5 F (36.4 C) (Oral)  Ht 5\' 6"  (1.676 m)  Wt 156 lb (70.761 kg)  BMI 25.19 kg/m2  SpO2 98%  LMP 10/04/2013  Body mass index is 25.19 kg/(m^2).  Physical Exam: Vital signs reviewed EXB:MWUX is a well-developed well-nourished alert cooperative   female who appears her stated age in no acute distress.  HEENT: normocephalic atraumatic , Eyes: PERRL EOM's full, conjunctiva clear, Nares: paten,t no deformity discharge or tenderness., Ears: no deformity EAC's clear TMs with normal landmarks. Mouth: clear OP, no lesions, edema.  Moist mucous membranes. Dentition in adequate repair. NECK: supple without masses, thyromegaly or bruits. CHEST/PULM:  Clear to auscultation and percussion breath sounds equal no wheeze , rales or rhonchi. No chest wall deformities or tenderness. CV: PMI is nondisplaced, S1 S2 no gallops, murmurs, rubs.  Peripheral pulses are full without delay.No JVD .  ABDOMEN: Bowel sounds normal nontender  No guard or rebound, no hepato splenomegal no CVA tenderness.  No hernia. Extremtities:  No clubbing cyanosis or edema, no acute joint swelling or redness no focal atrophy NEURO:  Oriented x3, cranial nerves 3-12 appear to be intact, no obvious focal weakness,gait within normal limits no abnormal reflexes or asymmetrical SKIN: No acute rashes normal turgor, color, no bruising or petechiae. PSYCH: Oriented, good eye contact, no obvious depression anxiety, cognition and judgment appear normal. LN: no cervical axillary inguinal adenopathy  Lab Results  Component Value Date   HGB 12.6 08/27/2011   See labs scanned tc 159 hdl 51 ratio 3.1 ldl 81 ASSESSMENT AND  PLAN:  Discussed the following assessment and plan:  Visit for preventive health examination  Need for Tdap vaccination - Plan: Tdap vaccine greater than or equal to 7yo IM  Elevated C-reactive protein (CRP) No evidence of neuropathy very healthy   Extensive panel of blood tests and hx  Was negative except low exercise an elevated crp.   Counseled.  Patient Care Team: Madelin Headings, MD as PCP - General Ailene Ards as Referring Physician (Obstetrics and Gynecology) Claudie Revering, MD as Consulting Physician (Dermatology) Patient Instructions  Continue lifestyle intervention healthy eating and exercise . 150 minutes of exercise weeks  ,   weight  To healthy levels. Avoid trans fats and processed foods;  Increase fresh fruits and veges to 5 servings per day. And avoid sweet beverages  Including tea and juice. crp is a non specific inflammation marker and is non specific . If is a marker for elevated risk fro populations  Not  A given person.   Mediterranean diet  Is heart healthy .   Preventive Care for Adults, Female A healthy lifestyle and preventive care can promote health and wellness. Preventive health guidelines for women include the following key practices.  A routine yearly physical is a good way to check with your caregiver about your health and preventive screening. It is a chance to share any concerns and updates on your health, and to receive a thorough exam.  Visit your dentist for a routine exam and preventive care every 6 months. Brush your teeth twice a day and floss once a day. Good oral hygiene prevents tooth decay and gum disease.  The frequency of eye exams is based on your age, health, family medical history, use of contact lenses, and other factors. Follow your caregiver's recommendations for frequency of eye exams.  Eat a healthy diet. Foods like vegetables, fruits, whole grains, low-fat dairy products, and lean protein foods contain the nutrients  you need without too many calories. Decrease your intake of foods high in solid fats, added sugars, and salt. Eat the right amount of calories for you.Get information about a proper diet from your caregiver, if necessary.  Regular physical exercise is one of the most important things you can do for your health. Most adults should get at least 150 minutes of moderate-intensity exercise (any activity that increases your heart rate and causes you to sweat) each week. In addition, most adults need muscle-strengthening exercises on 2 or more days a week.  Maintain a healthy weight. The body mass index (BMI) is a screening tool to identify possible weight problems. It provides an estimate of body fat based on height and weight. Your caregiver can help determine your BMI, and can help you achieve or maintain a healthy weight.For adults  20 years and older:  A BMI below 18.5 is considered underweight.  A BMI of 18.5 to 24.9 is normal.  A BMI of 25 to 29.9 is considered overweight.  A BMI of 30 and above is considered obese.  Maintain normal blood lipids and cholesterol levels by exercising and minimizing your intake of saturated fat. Eat a balanced diet with plenty of fruit and vegetables. Blood tests for lipids and cholesterol should begin at age 42 and be repeated every 5 years. If your lipid or cholesterol levels are high, you are over 50, or you are at high risk for heart disease, you may need your cholesterol levels checked more frequently.Ongoing high lipid and cholesterol levels should be treated with medicines if diet and exercise are not effective.  If you smoke, find out from your caregiver how to quit. If you do not use tobacco, do not start.  Lung cancer screening is recommended for adults aged 79 80 years who are at high risk for developing lung cancer because of a history of smoking. Yearly low-dose computed tomography (CT) is recommended for people who have at least a 30-pack-year history  of smoking and are a current smoker or have quit within the past 15 years. A pack year of smoking is smoking an average of 1 pack of cigarettes a day for 1 year (for example: 1 pack a day for 30 years or 2 packs a day for 15 years). Yearly screening should continue until the smoker has stopped smoking for at least 15 years. Yearly screening should also be stopped for people who develop a health problem that would prevent them from having lung cancer treatment.  If you are pregnant, do not drink alcohol. If you are breastfeeding, be very cautious about drinking alcohol. If you are not pregnant and choose to drink alcohol, do not exceed 1 drink per day. One drink is considered to be 12 ounces (355 mL) of beer, 5 ounces (148 mL) of wine, or 1.5 ounces (44 mL) of liquor.  Avoid use of street drugs. Do not share needles with anyone. Ask for help if you need support or instructions about stopping the use of drugs.  High blood pressure causes heart disease and increases the risk of stroke. Your blood pressure should be checked at least every 1 to 2 years. Ongoing high blood pressure should be treated with medicines if weight loss and exercise are not effective.  If you are 73 to 48 years old, ask your caregiver if you should take aspirin to prevent strokes.  Diabetes screening involves taking a blood sample to check your fasting blood sugar level. This should be done once every 3 years, after age 31, if you are within normal weight and without risk factors for diabetes. Testing should be considered at a younger age or be carried out more frequently if you are overweight and have at least 1 risk factor for diabetes.  Breast cancer screening is essential preventive care for women. You should practice "breast self-awareness." This means understanding the normal appearance and feel of your breasts and may include breast self-examination. Any changes detected, no matter how small, should be reported to a caregiver.  Women in their 64s and 30s should have a clinical breast exam (CBE) by a caregiver as part of a regular health exam every 1 to 3 years. After age 57, women should have a CBE every year. Starting at age 66, women should consider having a mammography (breast X-ray test) every year. Women  who have a family history of breast cancer should talk to their caregiver about genetic screening. Women at a high risk of breast cancer should talk to their caregivers about having magnetic resonance imaging (MRI) and a mammography every year.  Breast cancer gene (BRCA)-related cancer risk assessment is recommended for women who have family members with BRCA-related cancers. BRCA-related cancers include breast, ovarian, tubal, and peritoneal cancers. Having family members with these cancers may be associated with an increased risk for harmful changes (mutations) in the breast cancer genes BRCA1 and BRCA2. Results of the assessment will determine the need for genetic counseling and BRCA1 and BRCA2 testing.  The Pap test is a screening test for cervical cancer. A Pap test can show cell changes on the cervix that might become cervical cancer if left untreated. A Pap test is a procedure in which cells are obtained and examined from the lower end of the uterus (cervix).  Women should have a Pap test starting at age 57.  Between ages 64 and 88, Pap tests should be repeated every 2 years.  Beginning at age 68, you should have a Pap test every 3 years as long as the past 3 Pap tests have been normal.  Some women have medical problems that increase the chance of getting cervical cancer. Talk to your caregiver about these problems. It is especially important to talk to your caregiver if a new problem develops soon after your last Pap test. In these cases, your caregiver may recommend more frequent screening and Pap tests.  The above recommendations are the same for women who have or have not gotten the vaccine for human  papillomavirus (HPV).  If you had a hysterectomy for a problem that was not cancer or a condition that could lead to cancer, then you no longer need Pap tests. Even if you no longer need a Pap test, a regular exam is a good idea to make sure no other problems are starting.  If you are between ages 59 and 73, and you have had normal Pap tests going back 10 years, you no longer need Pap tests. Even if you no longer need a Pap test, a regular exam is a good idea to make sure no other problems are starting.  If you have had past treatment for cervical cancer or a condition that could lead to cancer, you need Pap tests and screening for cancer for at least 20 years after your treatment.  If Pap tests have been discontinued, risk factors (such as a new sexual partner) need to be reassessed to determine if screening should be resumed.  The HPV test is an additional test that may be used for cervical cancer screening. The HPV test looks for the virus that can cause the cell changes on the cervix. The cells collected during the Pap test can be tested for HPV. The HPV test could be used to screen women aged 46 years and older, and should be used in women of any age who have unclear Pap test results. After the age of 25, women should have HPV testing at the same frequency as a Pap test.  Colorectal cancer can be detected and often prevented. Most routine colorectal cancer screening begins at the age of 49 and continues through age 61. However, your caregiver may recommend screening at an earlier age if you have risk factors for colon cancer. On a yearly basis, your caregiver may provide home test kits to check for hidden blood in the stool.  Use of a small camera at the end of a tube, to directly examine the colon (sigmoidoscopy or colonoscopy), can detect the earliest forms of colorectal cancer. Talk to your caregiver about this at age 27, when routine screening begins. Direct examination of the colon should be  repeated every 5 to 10 years through age 40, unless early forms of pre-cancerous polyps or small growths are found.  Hepatitis C blood testing is recommended for all people born from [birth year] through 1966 and any individual with known risks for hepatitis C.  Practice safe sex. Use condoms and avoid high-risk sexual practices to reduce the spread of sexually transmitted infections (STIs). STIs include gonorrhea, chlamydia, syphilis, trichomonas, herpes, HPV, and human immunodeficiency virus (HIV). Herpes, HIV, and HPV are viral illnesses that have no cure. They can result in disability, cancer, and death. Sexually active women aged 85 and younger should be checked for chlamydia. Older women with new or multiple partners should also be tested for chlamydia. Testing for other STIs is recommended if you are sexually active and at increased risk.  Osteoporosis is a disease in which the bones lose minerals and strength with aging. This can result in serious bone fractures. The risk of osteoporosis can be identified using a bone density scan. Women ages 46 and over and women at risk for fractures or osteoporosis should discuss screening with their caregivers. Ask your caregiver whether you should take a calcium supplement or vitamin D to reduce the rate of osteoporosis.  Menopause can be associated with physical symptoms and risks. Hormone replacement therapy is available to decrease symptoms and risks. You should talk to your caregiver about whether hormone replacement therapy is right for you.  Use sunscreen. Apply sunscreen liberally and repeatedly throughout the day. You should seek shade when your shadow is shorter than you. Protect yourself by wearing long sleeves, pants, a wide-brimmed hat, and sunglasses year round, whenever you are outdoors.  Once a month, do a whole body skin exam, using a mirror to look at the skin on your back. Notify your caregiver of new moles, moles that have irregular borders, moles  that are larger than a pencil eraser, or moles that have changed in shape or color.  Stay current with required immunizations.  Influenza vaccine. All adults should be immunized every year.  Tetanus, diphtheria, and acellular pertussis (Td, Tdap) vaccine. Pregnant women should receive 1 dose of Tdap vaccine during each pregnancy. The dose should be obtained regardless of the length of time since the last dose. Immunization is preferred during the 27th to 36th week of gestation. An adult who has not previously received Tdap or who does not know her vaccine status should receive 1 dose of Tdap. This initial dose should be followed by tetanus and diphtheria toxoids (Td) booster doses every 10 years. Adults with an unknown or incomplete history of completing a 3-dose immunization series with Td-containing vaccines should begin or complete a primary immunization series including a Tdap dose. Adults should receive a Td booster every 10 years.  Varicella vaccine. An adult without evidence of immunity to varicella should receive 2 doses or a second dose if she has previously received 1 dose. Pregnant females who do not have evidence of immunity should receive the first dose after pregnancy. This first dose should be obtained before leaving the health care facility. The second dose should be obtained 4 8 weeks after the first dose.  Human papillomavirus (HPV) vaccine. Females aged 78 26 years who have not  received the vaccine previously should obtain the 3-dose series. The vaccine is not recommended for use in pregnant females. However, pregnancy testing is not needed before receiving a dose. If a female is found to be pregnant after receiving a dose, no treatment is needed. In that case, the remaining doses should be delayed until after the pregnancy. Immunization is recommended for any person with an immunocompromised condition through the age of 26 years if she did not get any or all doses earlier. During the  3-dose series, the second dose should be obtained 4 8 weeks after the first dose. The third dose should be obtained 24 weeks after the first dose and 16 weeks after the second dose.  Zoster vaccine. One dose is recommended for adults aged 32 years or older unless certain conditions are present.  Measles, mumps, and rubella (MMR) vaccine. Adults born before 10 generally are considered immune to measles and mumps. Adults born in [birth year] or later should have 1 or more doses of MMR vaccine unless there is a contraindication to the vaccine or there is laboratory evidence of immunity to each of the three diseases. A routine second dose of MMR vaccine should be obtained at least 28 days after the first dose for students attending postsecondary schools, health care workers, or international travelers. People who received inactivated measles vaccine or an unknown type of measles vaccine during 1963 1967 should receive 2 doses of MMR vaccine. People who received inactivated mumps vaccine or an unknown type of mumps vaccine before 1979 and are at high risk for mumps infection should consider immunization with 2 doses of MMR vaccine. For females of childbearing age, rubella immunity should be determined. If there is no evidence of immunity, females who are not pregnant should be vaccinated. If there is no evidence of immunity, females who are pregnant should delay immunization until after pregnancy. Unvaccinated health care workers born before [birth year] who lack laboratory evidence of measles, mumps, or rubella immunity or laboratory confirmation of disease should consider measles and mumps immunization with 2 doses of MMR vaccine or rubella immunization with 1 dose of MMR vaccine.  Pneumococcal 13-valent conjugate (PCV13) vaccine. When indicated, a person who is uncertain of her immunization history and has no record of immunization should receive the PCV13 vaccine. An adult aged 33 years or older who has certain medical  conditions and has not been previously immunized should receive 1 dose of PCV13 vaccine. This PCV13 should be followed with a dose of pneumococcal polysaccharide (PPSV23) vaccine. The PPSV23 vaccine dose should be obtained at least 8 weeks after the dose of PCV13 vaccine. An adult aged 40 years or older who has certain medical conditions and previously received 1 or more doses of PPSV23 vaccine should receive 1 dose of PCV13. The PCV13 vaccine dose should be obtained 1 or more years after the last PPSV23 vaccine dose.  Pneumococcal polysaccharide (PPSV23) vaccine. When PCV13 is also indicated, PCV13 should be obtained first. All adults aged 54 years and older should be immunized. An adult younger than age 10 years who has certain medical conditions should be immunized. Any person who resides in a nursing home or long-term care facility should be immunized. An adult smoker should be immunized. People with an immunocompromised condition and certain other conditions should receive both PCV13 and PPSV23 vaccines. People with human immunodeficiency virus (HIV) infection should be immunized as soon as possible after diagnosis. Immunization during chemotherapy or radiation therapy should be avoided. Routine use of PPSV23 vaccine  is not recommended for American Indians, [address], or people younger than 65 years unless there are medical conditions that require PPSV23 vaccine. When indicated, people who have unknown immunization and have no record of immunization should receive PPSV23 vaccine. One-time revaccination 5 years after the first dose of PPSV23 is recommended for people aged 38 64 years who have chronic kidney failure, nephrotic syndrome, asplenia, or immunocompromised conditions. People who received 1 2 doses of PPSV23 before age 43 years should receive another dose of PPSV23 vaccine at age 62 years or later if at least 5 years have passed since the previous dose. Doses of PPSV23 are not needed for people  immunized with PPSV23 at or after age 31 years.  Meningococcal vaccine. Adults with asplenia or persistent complement component deficiencies should receive 2 doses of quadrivalent meningococcal conjugate (MenACWY-D) vaccine. The doses should be obtained at least 2 months apart. Microbiologists working with certain meningococcal bacteria, military recruits, people at risk during an outbreak, and people who travel to or live in countries with a high rate of meningitis should be immunized. A first-year college student up through age 59 years who is living in a residence hall should receive a dose if she did not receive a dose on or after her 16th birthday. Adults who have certain high-risk conditions should receive one or more doses of vaccine.  Hepatitis A vaccine. Adults who wish to be protected from this disease, have certain high-risk conditions, work with hepatitis A-infected animals, work in hepatitis A research labs, or travel to or work in countries with a high rate of hepatitis A should be immunized. Adults who were previously unvaccinated and who anticipate close contact with an international adoptee during the first 60 days after arrival in the [place] from a country with a high rate of hepatitis A should be immunized.  Hepatitis B vaccine. Adults who wish to be protected from this disease, have certain high-risk conditions, may be exposed to blood or other infectious body fluids, are household contacts or sex partners of hepatitis B positive people, are clients or workers in certain care facilities, or travel to or work in countries with a high rate of hepatitis B should be immunized.  Haemophilus influenzae type b (Hib) vaccine. A previously unvaccinated person with asplenia or sickle cell disease or having a scheduled splenectomy should receive 1 dose of Hib vaccine. Regardless of previous immunization, a recipient of a hematopoietic stem cell transplant should receive a 3-dose series 6 12  months after her successful transplant. Hib vaccine is not recommended for adults with HIV infection. Preventive Services / Frequency Ages 14 to 56  Blood pressure check.** / Every 1 to 2 years.  Lipid and cholesterol check.** / Every 5 years beginning at age 23.  Clinical breast exam.** / Every 3 years for women in their 66s and 30s.  BRCA-related cancer risk assessment.** / For women who have family members with a BRCA-related cancer (breast, ovarian, tubal, or peritoneal cancers).  Pap test.** / Every 2 years from ages 2 through 16. Every 3 years starting at age 43 through age 42 or 65 with a history of 3 consecutive normal Pap tests.  HPV screening.** / Every 3 years from ages 100 through ages 62 to 9 with a history of 3 consecutive normal Pap tests.  Hepatitis C blood test.** / For any individual with known risks for hepatitis C.  Skin self-exam. / Monthly.  Influenza vaccine. / Every year.  Tetanus, diphtheria, and acellular pertussis (  Tdap, Td) vaccine.** / Consult your caregiver. Pregnant women should receive 1 dose of Tdap vaccine during each pregnancy. 1 dose of Td every 10 years.  Varicella vaccine.** / Consult your caregiver. Pregnant females who do not have evidence of immunity should receive the first dose after pregnancy.  HPV vaccine. / 3 doses over 6 months, if 26 and younger. The vaccine is not recommended for use in pregnant females. However, pregnancy testing is not needed before receiving a dose.  Measles, mumps, rubella (MMR) vaccine.** / You need at least 1 dose of MMR if you were born in 1957 or later. You may also need a 2nd dose. For females of childbearing age, rubella immunity should be determined. If there is no evidence of immunity, females who are not pregnant should be vaccinated. If there is no evidence of immunity, females who are pregnant should delay immunization until after pregnancy.  Pneumococcal 13-valent conjugate (PCV13) vaccine.** / Consult  your caregiver.  Pneumococcal polysaccharide (PPSV23) vaccine.** / 1 to 2 doses if you smoke cigarettes or if you have certain conditions.  Meningococcal vaccine.** / 1 dose if you are age 76 to 39 years and a Orthoptist living in a residence hall, or have one of several medical conditions, you need to get vaccinated against meningococcal disease. You may also need additional booster doses.  Hepatitis A vaccine.** / Consult your caregiver.  Hepatitis B vaccine.** / Consult your caregiver.  Haemophilus influenzae type b (Hib) vaccine.** / Consult your caregiver. Ages 87 to 63  Blood pressure check.** / Every 1 to 2 years.  Lipid and cholesterol check.** / Every 5 years beginning at age 39.  Lung cancer screening. / Every year if you are aged 9 80 years and have a 30-pack-year history of smoking and currently smoke or have quit within the past 15 years. Yearly screening is stopped once you have quit smoking for at least 15 years or develop a health problem that would prevent you from having lung cancer treatment.  Clinical breast exam.** / Every year after age 37.  BRCA-related cancer risk assessment.** / For women who have family members with a BRCA-related cancer (breast, ovarian, tubal, or peritoneal cancers).  Mammogram.** / Every year beginning at age 54 and continuing for as long as you are in good health. Consult with your caregiver.  Pap test.** / Every 3 years starting at age 87 through age 28 or 27 with a history of 3 consecutive normal Pap tests.  HPV screening.** / Every 3 years from ages 45 through ages 75 to 44 with a history of 3 consecutive normal Pap tests.  Fecal occult blood test (FOBT) of stool. / Every year beginning at age 29 and continuing until age 35. You may not need to do this test if you get a colonoscopy every 10 years.  Flexible sigmoidoscopy or colonoscopy.** / Every 5 years for a flexible sigmoidoscopy or every 10 years for a colonoscopy  beginning at age 17 and continuing until age 23.  Hepatitis C blood test.** / For all people born from [birth year] through 1966 and any individual with known risks for hepatitis C.  Skin self-exam. / Monthly.  Influenza vaccine. / Every year.  Tetanus, diphtheria, and acellular pertussis (Tdap/Td) vaccine.** / Consult your caregiver. Pregnant women should receive 1 dose of Tdap vaccine during each pregnancy. 1 dose of Td every 10 years.  Varicella vaccine.** / Consult your caregiver. Pregnant females who do not have evidence of immunity should receive the  first dose after pregnancy.  Zoster vaccine.** / 1 dose for adults aged 55 years or older.  Measles, mumps, rubella (MMR) vaccine.** / You need at least 1 dose of MMR if you were born in 1957 or later. You may also need a 2nd dose. For females of childbearing age, rubella immunity should be determined. If there is no evidence of immunity, females who are not pregnant should be vaccinated. If there is no evidence of immunity, females who are pregnant should delay immunization until after pregnancy.  Pneumococcal 13-valent conjugate (PCV13) vaccine.** / Consult your caregiver.  Pneumococcal polysaccharide (PPSV23) vaccine.** / 1 to 2 doses if you smoke cigarettes or if you have certain conditions.  Meningococcal vaccine.** / Consult your caregiver.  Hepatitis A vaccine.** / Consult your caregiver.  Hepatitis B vaccine.** / Consult your caregiver.  Haemophilus influenzae type b (Hib) vaccine.** / Consult your caregiver. Ages 67 and over  Blood pressure check.** / Every 1 to 2 years.  Lipid and cholesterol check.** / Every 5 years beginning at age 62.  Lung cancer screening. / Every year if you are aged 74 80 years and have a 30-pack-year history of smoking and currently smoke or have quit within the past 15 years. Yearly screening is stopped once you have quit smoking for at least 15 years or develop a health problem that would prevent you  from having lung cancer treatment.  Clinical breast exam.** / Every year after age 85.  BRCA-related cancer risk assessment.** / For women who have family members with a BRCA-related cancer (breast, ovarian, tubal, or peritoneal cancers).  Mammogram.** / Every year beginning at age 88 and continuing for as long as you are in good health. Consult with your caregiver.  Pap test.** / Every 3 years starting at age 50 through age 58 or 106 with a 3 consecutive normal Pap tests. Testing can be stopped between 65 and 70 with 3 consecutive normal Pap tests and no abnormal Pap or HPV tests in the past 10 years.  HPV screening.** / Every 3 years from ages 66 through ages 44 or 17 with a history of 3 consecutive normal Pap tests. Testing can be stopped between 65 and 70 with 3 consecutive normal Pap tests and no abnormal Pap or HPV tests in the past 10 years.  Fecal occult blood test (FOBT) of stool. / Every year beginning at age 78 and continuing until age 58. You may not need to do this test if you get a colonoscopy every 10 years.  Flexible sigmoidoscopy or colonoscopy.** / Every 5 years for a flexible sigmoidoscopy or every 10 years for a colonoscopy beginning at age 77 and continuing until age 36.  Hepatitis C blood test.** / For all people born from [birth year] through 1966 and any individual with known risks for hepatitis C.  Osteoporosis screening.** / A one-time screening for women ages 37 and over and women at risk for fractures or osteoporosis.  Skin self-exam. / Monthly.  Influenza vaccine. / Every year.  Tetanus, diphtheria, and acellular pertussis (Tdap/Td) vaccine.** / 1 dose of Td every 10 years.  Varicella vaccine.** / Consult your caregiver.  Zoster vaccine.** / 1 dose for adults aged 76 years or older.  Pneumococcal 13-valent conjugate (PCV13) vaccine.** / Consult your caregiver.  Pneumococcal polysaccharide (PPSV23) vaccine.** / 1 dose for all adults aged 22 years and  older.  Meningococcal vaccine.** / Consult your caregiver.  Hepatitis A vaccine.** / Consult your caregiver.  Hepatitis B vaccine.** / Consult  your caregiver.  Haemophilus influenzae type b (Hib) vaccine.** / Consult your caregiver. ** Family history and personal history of risk and conditions may change your caregiver's recommendations. Document Released: 01/18/2002 Document Revised: 03/19/2013 Document Reviewed: 04/19/2011 [facility] Patient Information 2014 [place], [place].       Neta Mends. Panosh M.D.   Health Maintenance  Topic Date Due  . Mammogram  05/01/2014  . Influenza Vaccine  07/06/2014  . Pap Smear  06/13/2016  . Tetanus/tdap  10/25/2023   Health Maintenance Review } Pre visit review using our clinic review tool, if applicable. No additional management support is needed unless otherwise documented below in the visit note.

## 2013-10-25 ENCOUNTER — Encounter: Payer: Self-pay | Admitting: Internal Medicine

## 2013-12-25 ENCOUNTER — Telehealth: Payer: Self-pay | Admitting: *Deleted

## 2013-12-25 NOTE — Telephone Encounter (Signed)
Pt is going on an a 11 day cruise in February and is requesting some scopolamine patches sent to [facility].

## 2013-12-25 NOTE — Telephone Encounter (Signed)
Ok to send in 4 transderm scopolamine patches   Place pre cruise  for up to 72 hours

## 2013-12-25 NOTE — Telephone Encounter (Signed)
Had CPE 10/2013.  Please advise.  Thanks!

## 2013-12-26 ENCOUNTER — Other Ambulatory Visit: Payer: Self-pay | Admitting: Family Medicine

## 2013-12-26 MED ORDER — SCOPOLAMINE 1 MG/3DAYS TD PT72: 1.0000 | MEDICATED_PATCH | TRANSDERMAL | Status: DC

## 2013-12-26 NOTE — Telephone Encounter (Signed)
Left message on the patient's cell informing her to pick up rx at the pharmacy.

## 2014-02-05 ENCOUNTER — Ambulatory Visit (INDEPENDENT_AMBULATORY_CARE_PROVIDER_SITE_OTHER): Payer: BC Managed Care – PPO | Admitting: Internal Medicine

## 2014-02-05 ENCOUNTER — Encounter: Payer: Self-pay | Admitting: Internal Medicine

## 2014-02-05 VITALS — BP 120/72 | Temp 98.8°F | Ht 66.0 in | Wt 159.0 lb

## 2014-02-05 DIAGNOSIS — IMO0002 Reserved for concepts with insufficient information to code with codable children: Secondary | ICD-10-CM

## 2014-02-05 DIAGNOSIS — S86911A Strain of unspecified muscle(s) and tendon(s) at lower leg level, right leg, initial encounter: Secondary | ICD-10-CM

## 2014-02-05 DIAGNOSIS — B9789 Other viral agents as the cause of diseases classified elsewhere: Secondary | ICD-10-CM

## 2014-02-05 DIAGNOSIS — J0100 Acute maxillary sinusitis, unspecified: Secondary | ICD-10-CM

## 2014-02-05 DIAGNOSIS — J069 Acute upper respiratory infection, unspecified: Secondary | ICD-10-CM

## 2014-02-05 MED ORDER — AMOXICILLIN 875 MG PO TABS: 875.0000 mg | ORAL_TABLET | Freq: Three times a day (TID) | ORAL | Status: DC

## 2014-02-05 NOTE — Patient Instructions (Addendum)
This is a primary  Viral illness  But you have a sinusitis also . Sinus infection can get better with decongestants saline nose spray and time. However if your sinus pain is not relieved in another 24- 48 hours add the antibiotic .  Decongestants  Try mucinex d  In day poss afrin at night but no more than 3 nights in a row to avoid rebound.  Sinusitis Sinusitis is redness, soreness, and swelling (inflammation) of the paranasal sinuses. Paranasal sinuses are air pockets within the bones of your face (beneath the eyes, the middle of the forehead, or above the eyes). In healthy paranasal sinuses, mucus is able to drain out, and air is able to circulate through them by way of your nose. However, when your paranasal sinuses are inflamed, mucus and air can become trapped. This can allow bacteria and other germs to grow and cause infection. Sinusitis can develop quickly and last only a short time (acute) or continue over a long period (chronic). Sinusitis that lasts for more than 12 weeks is considered chronic.  CAUSES  Causes of sinusitis include:  Allergies.  Structural abnormalities, such as displacement of the cartilage that separates your nostrils (deviated septum), which can decrease the air flow through your nose and sinuses and affect sinus drainage.  Functional abnormalities, such as when the small hairs (cilia) that line your sinuses and help remove mucus do not work properly or are not present. SYMPTOMS  Symptoms of acute and chronic sinusitis are the same. The primary symptoms are pain and pressure around the affected sinuses. Other symptoms include:  Upper toothache.  Earache.  Headache.  Bad breath.  Decreased sense of smell and taste.  A cough, which worsens when you are lying flat.  Fatigue.  Fever.  Thick drainage from your nose, which often is green and may contain pus (purulent).  Swelling and warmth over the affected sinuses. DIAGNOSIS  Your caregiver will perform  a physical exam. During the exam, your caregiver may:  Look in your nose for signs of abnormal growths in your nostrils (nasal polyps).  Tap over the affected sinus to check for signs of infection.  View the inside of your sinuses (endoscopy) with a special imaging device with a light attached (endoscope), which is inserted into your sinuses. If your caregiver suspects that you have chronic sinusitis, one or more of the following tests may be recommended:  Allergy tests.  Nasal culture A sample of mucus is taken from your nose and sent to a lab and screened for bacteria.  Nasal cytology A sample of mucus is taken from your nose and examined by your caregiver to determine if your sinusitis is related to an allergy. TREATMENT  Most cases of acute sinusitis are related to a viral infection and will resolve on their own within 10 days. Sometimes medicines are prescribed to help relieve symptoms (pain medicine, decongestants, nasal steroid sprays, or saline sprays).  However, for sinusitis related to a bacterial infection, your caregiver will prescribe antibiotic medicines. These are medicines that will help kill the bacteria causing the infection.  Rarely, sinusitis is caused by a fungal infection. In theses cases, your caregiver will prescribe antifungal medicine. For some cases of chronic sinusitis, surgery is needed. Generally, these are cases in which sinusitis recurs more than 3 times per year, despite other treatments. HOME CARE INSTRUCTIONS   Drink plenty of water. Water helps thin the mucus so your sinuses can drain more easily.  Use a humidifier.  Inhale  steam 3 to 4 times a day (for example, sit in the bathroom with the shower running).  Apply a warm, moist washcloth to your face 3 to 4 times a day, or as directed by your caregiver.  Use saline nasal sprays to help moisten and clean your sinuses.  Take over-the-counter or prescription medicines for pain, discomfort, or fever only  as directed by your caregiver. SEEK IMMEDIATE MEDICAL CARE IF:  You have increasing pain or severe headaches.  You have nausea, vomiting, or drowsiness.  You have swelling around your face.  You have vision problems.  You have a stiff neck.  You have difficulty breathing. MAKE SURE YOU:   Understand these instructions.  Will watch your condition.  Will get help right away if you are not doing well or get worse. Document Released: 11/22/2005 Document Revised: 02/14/2012 Document Reviewed: 12/07/2011 [facility] Patient Information 2014 [place], [place].

## 2014-02-05 NOTE — Progress Notes (Signed)
Chief Complaint  Patient presents with  . Cough    Started 4 days ago. Cough is productive of a yellow sputum.  Has tried Dayquil OTC and Nyquil OTC.  Marland Kitchen. Nasal Congestion  . Sore Throat  . Sinus Pressure    HPI: Patient comes in today for SDA for  new problem evaluation. Onset 4 days .  Feels wheezing and hard to breath and now sinus hurts. Very badly in face very congested   daquil and nyquild help some.   Cough  . No sob hemoptysis   Family with hiollenss st fever but not face pain .  Perimenopause  2 weeks late. Husband had vascectomy.   Medial right knee pain after zumba getting better  But check no pop or lof ROS: See pertinent positives and negatives per HPI.  Past Medical History  Diagnosis Date  . GERD (gastroesophageal reflux disease)     Family History  Problem Relation Age of Onset  . Diabetes      grandparents  . Hypertension      grandparents and other blood relative  . Heart disease      grandparents  . Neuropathy Mother   . Sjogren's syndrome Mother     History   Social History  . Marital Status: Married    Spouse Name: N/A    Number of Children: N/A  . Years of Education: N/A   Social History Main Topics  . Smoking status: Never Smoker   . Smokeless tobacco: None  . Alcohol Use: Yes     Comment: Occ.  . Drug Use: No  . Sexual Activity: None   Other Topics Concern  . 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   caffene   No sugars  Tea.     Outpatient Encounter Prescriptions as of 02/05/2014  Medication Sig  . B Complex-C (SUPER B COMPLEX PO) Take by mouth.  . Cholecalciferol (VITAMIN D PO) Take by mouth.  . Nutritional Supplements (JUICE PLUS FIBRE PO) Take by mouth.  Marland Kitchen. scopolamine (TRANSDERM-SCOP) 1.5 MG Place 1 patch (1.5 mg total) onto the skin every 3 (three) days.  Marland Kitchen. amoxicillin (AMOXIL) 875 MG tablet  Take 1 tablet (875 mg total) by mouth 3 (three) times daily.    EXAM:  BP 120/72  Temp(Src) 98.8 F (37.1 C) (Oral)  Ht 5\' 6"  (1.676 m)  Wt 159 lb (72.122 kg)  BMI 25.68 kg/m2  Body mass index is 25.68 kg/(m^2). WDWN in NAD  quiet respirations; very congested  somewhat hoarse. Non toxic . HEENT: Normocephalic ;atraumatic , Eyes;  PERRL, EOMs  Full, lids and conjunctiva clear,,Ears: no deformities, canals nl, TM landmarks normal,clear fluid  Nose: no deformity or discharge but 3+congested;face max tender  tender Mouth : OP clear without lesion or edema .mild red uvula Neck: Supple without adenopathy or masses or bruits Chest:  Clear to A&P without wheezes rales or rhonchi CV:  S1-S2 no gallops or murmurs peripheral perfusion is normal Skin :nl perfusion and no acute rashes  Right knee mild peri pat swelling medial tender but no instabilty PSYCH: pleasant and cooperative, no obvious depression or anxiety  ASSESSMENT AND PLAN:  Discussed the following assessment and plan:  Sinusitis, acute maxillary - complicating viral  decongestant add antibiotic if unreleived  Viral upper respiratory  tract infection with cough  Strain of right knee - prob mild mcl should improve with time adapt exercise   notify health care team  if symptoms worsen or persist or new concerns arise.  Patient Instructions  This is a primary  Viral illness  But you have a sinusitis also . Sinus infection can get better with decongestants saline nose spray and time. However if your sinus pain is not relieved in another 24- 48 hours add the antibiotic .  Decongestants  Try mucinex d  In day poss afrin at night but no more than 3 nights in a row to avoid rebound.  Sinusitis Sinusitis is redness, soreness, and swelling (inflammation) of the paranasal sinuses. Paranasal sinuses are air pockets within the bones of your face (beneath the eyes, the middle of the forehead, or above the eyes). In healthy paranasal  sinuses, mucus is able to drain out, and air is able to circulate through them by way of your nose. However, when your paranasal sinuses are inflamed, mucus and air can become trapped. This can allow bacteria and other germs to grow and cause infection. Sinusitis can develop quickly and last only a short time (acute) or continue over a long period (chronic). Sinusitis that lasts for more than 12 weeks is considered chronic.  CAUSES  Causes of sinusitis include:  Allergies.  Structural abnormalities, such as displacement of the cartilage that separates your nostrils (deviated septum), which can decrease the air flow through your nose and sinuses and affect sinus drainage.  Functional abnormalities, such as when the small hairs (cilia) that line your sinuses and help remove mucus do not work properly or are not present. SYMPTOMS  Symptoms of acute and chronic sinusitis are the same. The primary symptoms are pain and pressure around the affected sinuses. Other symptoms include:  Upper toothache.  Earache.  Headache.  Bad breath.  Decreased sense of smell and taste.  A cough, which worsens when you are lying flat.  Fatigue.  Fever.  Thick drainage from your nose, which often is green and may contain pus (purulent).  Swelling and warmth over the affected sinuses. DIAGNOSIS  Your caregiver will perform a physical exam. During the exam, your caregiver may:  Look in your nose for signs of abnormal growths in your nostrils (nasal polyps).  Tap over the affected sinus to check for signs of infection.  View the inside of your sinuses (endoscopy) with a special imaging device with a light attached (endoscope), which is inserted into your sinuses. If your caregiver suspects that you have chronic sinusitis, one or more of the following tests may be recommended:  Allergy tests.  Nasal culture A sample of mucus is taken from your nose and sent to a lab and screened for bacteria.  Nasal  cytology A sample of mucus is taken from your nose and examined by your caregiver to determine if your sinusitis is related to an allergy. TREATMENT  Most cases of acute sinusitis are related to a viral infection and will resolve on their own within 10 days. Sometimes medicines are prescribed to help relieve symptoms (pain medicine, decongestants, nasal steroid sprays, or saline sprays).  However, for sinusitis related to a bacterial infection, your caregiver will prescribe antibiotic medicines. These are medicines that will help kill the bacteria causing the infection.  Rarely, sinusitis is caused by a fungal infection. In theses cases, your caregiver will prescribe antifungal medicine. For some cases of chronic sinusitis, surgery is needed. Generally, these are cases  in which sinusitis recurs more than 3 times per year, despite other treatments. HOME CARE INSTRUCTIONS   Drink plenty of water. Water helps thin the mucus so your sinuses can drain more easily.  Use a humidifier.  Inhale steam 3 to 4 times a day (for example, sit in the bathroom with the shower running).  Apply a warm, moist washcloth to your face 3 to 4 times a day, or as directed by your caregiver.  Use saline nasal sprays to help moisten and clean your sinuses.  Take over-the-counter or prescription medicines for pain, discomfort, or fever only as directed by your caregiver. SEEK IMMEDIATE MEDICAL CARE IF:  You have increasing pain or severe headaches.  You have nausea, vomiting, or drowsiness.  You have swelling around your face.  You have vision problems.  You have a stiff neck.  You have difficulty breathing. MAKE SURE YOU:   Understand these instructions.  Will watch your condition.  Will get help right away if you are not doing well or get worse. Document Released: 11/22/2005 Document Revised: 02/14/2012 Document Reviewed: 12/07/2011 [facility] Patient Information 2014 [place], [place].        Neta Mends. Basia Mcginty M.D.  Pre visit review using our clinic review tool, if applicable. No additional management support is needed unless otherwise documented below in the visit note.

## 2014-02-11 ENCOUNTER — Telehealth: Payer: Self-pay | Admitting: Internal Medicine

## 2014-02-11 NOTE — Telephone Encounter (Signed)
Pt was given a rx last week if she wasn't better and pt would like a cb to ensure this is the correct rx b/c some symptoms have changed. pls call

## 2014-02-11 NOTE — Telephone Encounter (Signed)
Spoke to the pt.  She has not yet started the antibiotic.  [facility]  Has been taking Mucinex D.   She continues to have   Yellow chest congestion  Post Nasal Drip  Burning sensation in her nose  Cough  She is going on a trip at the end of the month and wants to be better.  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  She wanted to know if a z-pak would be better.  Advised the pt that Dr. Fabian SharpPanosh has prescribed her amoxicillin and that should take care of her infection.  Advised she should start on the antibiotic right away and complete it so if there are problems before she leaves for vacation, she can call the office.  Pt understood and will pick up rx at the pharmacy.

## 2014-02-15 ENCOUNTER — Telehealth: Payer: Self-pay | Admitting: Family Medicine

## 2014-02-15 ENCOUNTER — Telehealth: Payer: Self-pay | Admitting: Internal Medicine

## 2014-02-15 MED ORDER — AZITHROMYCIN 250 MG PO TABS
ORAL_TABLET | ORAL | Status: DC
Start: 2014-02-15 — End: 2015-09-29

## 2014-02-15 NOTE — Telephone Encounter (Signed)
Patient notified by telephone. 

## 2014-02-15 NOTE — Telephone Encounter (Signed)
If she is not already, start taking Zyrtec-D as well. Continue Amoxil

## 2014-02-15 NOTE — Telephone Encounter (Signed)
Was seen by PCP on 02/05/14 and prescribed an antibiotic, pt states she is not feeling better, [facility] to [facility], declined triage.

## 2014-02-15 NOTE — Telephone Encounter (Signed)
Her Amoxicillin is not helping so we will switch to a Zpack

## 2014-02-15 NOTE — Telephone Encounter (Signed)
Spoke to the pt.  She is on day 5 of amoxicillin.  Continues to have pale yellow or clear congestion from her nose and chest.  She has no taste or smell.  Unsure if she has a fever but has sweats.  Also using Mucinex.  Says she does feel better than what she did in the beginning.  Would like to make sure she is on the correct antibiotic.  She is leaving to go out of town and does wants to feel better.  Please advise.  Thanks!

## 2014-03-29 ENCOUNTER — Other Ambulatory Visit: Payer: Self-pay | Admitting: Obstetrics and Gynecology

## 2014-03-29 DIAGNOSIS — Z1231 Encounter for screening mammogram for malignant neoplasm of breast: Secondary | ICD-10-CM

## 2014-05-07 ENCOUNTER — Ambulatory Visit (INDEPENDENT_AMBULATORY_CARE_PROVIDER_SITE_OTHER): Payer: BC Managed Care – PPO

## 2014-05-07 DIAGNOSIS — Z1231 Encounter for screening mammogram for malignant neoplasm of breast: Secondary | ICD-10-CM

## 2014-10-07 ENCOUNTER — Encounter: Payer: Self-pay | Admitting: Internal Medicine

## 2015-04-14 ENCOUNTER — Other Ambulatory Visit: Payer: Self-pay

## 2015-04-14 DIAGNOSIS — Z1231 Encounter for screening mammogram for malignant neoplasm of breast: Secondary | ICD-10-CM

## 2015-05-19 ENCOUNTER — Ambulatory Visit
Admission: RE | Admit: 2015-05-19 | Discharge: 2015-05-19 | Disposition: A | Discharge status: Home / Self Care | Payer: BLUE CROSS/BLUE SHIELD | Source: Ambulatory Visit

## 2015-05-19 DIAGNOSIS — Z1231 Encounter for screening mammogram for malignant neoplasm of breast: Secondary | ICD-10-CM

## 2015-09-26 ENCOUNTER — Telehealth: Payer: Self-pay | Admitting: Internal Medicine

## 2015-09-26 NOTE — Telephone Encounter (Signed)
Yes ok 

## 2015-09-26 NOTE — Telephone Encounter (Signed)
Pt is having gerd flare up and would like to come in on Monday 10/24-16. Pt is not working Monday. Can I use sda slot?

## 2015-09-26 NOTE — Telephone Encounter (Signed)
Pt has been sch

## 2015-09-29 ENCOUNTER — Ambulatory Visit (INDEPENDENT_AMBULATORY_CARE_PROVIDER_SITE_OTHER): Payer: BLUE CROSS/BLUE SHIELD | Admitting: Internal Medicine

## 2015-09-29 ENCOUNTER — Encounter: Payer: Self-pay | Admitting: Internal Medicine

## 2015-09-29 VITALS — BP 110/74 | Temp 98.5°F | Wt 163.1 lb

## 2015-09-29 DIAGNOSIS — R635 Abnormal weight gain: Secondary | ICD-10-CM | POA: Diagnosis not present

## 2015-09-29 DIAGNOSIS — R7982 Elevated C-reactive protein (CRP): Secondary | ICD-10-CM | POA: Diagnosis not present

## 2015-09-29 DIAGNOSIS — K219 Gastro-esophageal reflux disease without esophagitis: Secondary | ICD-10-CM

## 2015-09-29 DIAGNOSIS — Z1211 Encounter for screening for malignant neoplasm of colon: Secondary | ICD-10-CM | POA: Diagnosis not present
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
## 2015-09-29 NOTE — Patient Instructions (Addendum)
Food Choices for Gastroesophageal Reflux Disease, Adult When you have gastroesophageal reflux disease (GERD), the foods you eat and your eating habits are very important. Choosing the right foods can help ease the discomfort of GERD. WHAT GENERAL GUIDELINES DO I NEED TO FOLLOW?  Choose fruits, vegetables, whole grains, low-fat dairy products, and low-fat meat, fish, and poultry.  Limit fats such as oils, salad dressings, butter, nuts, and avocado.  Keep a food diary to identify foods that cause symptoms.  Avoid foods that cause reflux. These may be different for different people.  Eat frequent small meals instead of three large meals each day.  Eat your meals slowly, in a relaxed setting.  Limit fried foods.  Cook foods using methods other than frying.  Avoid drinking alcohol.  Avoid drinking large amounts of liquids with your meals.  Avoid bending over or lying down until 2-3 hours after eating. WHAT FOODS ARE NOT RECOMMENDED? The following are some foods and drinks that may worsen your symptoms: Vegetables Tomatoes. Tomato juice. Tomato and spaghetti sauce. Chili peppers. Onion and garlic. Horseradish. Fruits Oranges, grapefruit, and lemon (fruit and juice). Meats High-fat meats, fish, and poultry. This includes hot dogs, ribs, ham, sausage, salami, and bacon. Dairy Whole milk and chocolate milk. Sour cream. Cream. Butter. Ice cream. Cream cheese.  Beverages Coffee and tea, with or without caffeine. Carbonated beverages or energy drinks. Condiments Hot sauce. Barbecue sauce.  Sweets/Desserts Chocolate and cocoa. Donuts. Peppermint and spearmint. Fats and Oils High-fat foods, including [place] fries and potato chips. Other Vinegar. Strong spices, such as black pepper, white pepper, red pepper, cayenne, curry powder, cloves, ginger, and chili powder. The items listed above may not be a complete list of foods and beverages to avoid. Contact your dietitian for more  information.   This information is not intended to replace advice given to you by your health care provider. Make sure you discuss any questions you have with your health care provider.   Document Released: 11/22/2005 Document Revised: 12/13/2014 Document Reviewed: 09/26/2013 Elsevier Interactive Patient Education 2016 Elsevier Inc.  Try  prilosec  Qd for 2 weeks and then transition to ranitidine.   150 twice a day     if still need   Problem .   Then plan follow up.       Why follow it? Research shows. . Those who follow the Mediterranean diet have a reduced risk of heart disease  . The diet is associated with a reduced incidence of Parkinson's and Alzheimer's diseases . People following the diet may have longer life expectancies and lower rates of chronic diseases  . The Dietary Guidelines for Americans recommends the Mediterranean diet as an eating plan to promote health and prevent disease  What Is the Mediterranean Diet?  . Healthy eating plan based on typical foods and recipes of Mediterranean-style cooking . The diet is primarily a plant based diet; these foods should make up a majority of meals   Starches - Plant based foods should make up a majority of meals - They are an important sources of vitamins, minerals, energy, antioxidants, and fiber - Choose whole grains, foods high in fiber and minimally processed items  - Typical grain sources include wheat, oats, barley, corn, brown rice, bulgar, farro, millet, polenta, couscous  - Various types of beans include chickpeas, lentils, fava beans, black beans, white beans   Fruits  Veggies - Large quantities of antioxidant rich fruits & veggies; 6 or more servings  -  Vegetables can be eaten raw or lightly drizzled with oil and cooked  - Vegetables common to the traditional Mediterranean Diet include: artichokes, arugula, beets, broccoli, brussel sprouts, cabbage, carrots, celery, collard greens, cucumbers, eggplant, kale, leeks,  lemons, lettuce, mushrooms, okra, onions, peas, peppers, potatoes, pumpkin, radishes, rutabaga, shallots, spinach, sweet potatoes, turnips, zucchini - Fruits common to the Mediterranean Diet include: apples, apricots, avocados, cherries, clementines, dates, figs, grapefruits, grapes, melons, nectarines, oranges, peaches, pears, pomegranates, strawberries, tangerines  Fats - Replace butter and margarine with healthy oils, such as olive oil, canola oil, and tahini  - Limit nuts to no more than a handful a day  - Nuts include walnuts, almonds, pecans, pistachios, pine nuts  - Limit or avoid candied, honey roasted or heavily salted nuts - Olives are central to the [employer] - can be eaten whole or used in a variety of dishes   Meats Protein - Limiting red meat: no more than a few times a month - When eating red meat: choose lean cuts and keep the portion to the size of deck of cards - Eggs: approx. 0 to 4 times a week  - Fish and lean poultry: at least 2 a week  - Healthy protein sources include, chicken, [place], lean beef, lamb - Increase intake of seafood such as tuna, salmon, trout, mackerel, shrimp, scallops - Avoid or limit high fat processed meats such as sausage and bacon  Dairy - Include moderate amounts of low fat dairy products  - Focus on healthy dairy such as fat free yogurt, skim milk, low or reduced fat cheese - Limit dairy products higher in fat such as whole or 2% milk, cheese, ice cream  Alcohol - Moderate amounts of red wine is ok  - No more than 5 oz daily for women (all ages) and men older than age 37  - No more than 10 oz of wine daily for men younger than 88  Other - Limit sweets and other desserts  - Use herbs and spices instead of salt to flavor foods  - Herbs and spices common to the traditional Mediterranean Diet include: basil, bay leaves, chives, cloves, cumin, fennel, garlic, lavender, marjoram, mint, oregano, parsley, pepper, rosemary, sage, savory, sumac,  tarragon, thyme   It's not just a diet, it's a lifestyle:  . The Mediterranean diet includes lifestyle factors typical of those in the region  . Foods, drinks and meals are best eaten with others and savored . Daily physical activity is important for overall good health . This could be strenuous exercise like running and aerobics . This could also be more leisurely activities such as walking, housework, yard-work, or taking the stairs . Moderation is the key; a balanced and healthy diet accommodates most foods and drinks . Consider portion sizes and frequency of consumption of certain foods   Meal Ideas & Options:  . Breakfast:  o Whole wheat toast or whole wheat English muffins with peanut butter & hard boiled egg o Steel cut oats topped with apples & cinnamon and skim milk  o Fresh fruit: banana, strawberries, melon, berries, peaches  o Smoothies: strawberries, bananas, greek yogurt, peanut butter o Low fat greek yogurt with blueberries and granola  o Egg white omelet with spinach and mushrooms o Breakfast couscous: whole wheat couscous, apricots, skim milk, cranberries  . Sandwiches:  o Hummus and grilled vegetables (peppers, zucchini, squash) on whole wheat bread   o Grilled chicken on whole wheat pita with lettuce, tomatoes, cucumbers or tzatziki  o Tuna salad on whole wheat bread: tuna salad made with greek yogurt, olives, red peppers, capers, green onions o Garlic rosemary lamb pita: lamb sauted with garlic, rosemary, salt & pepper; add lettuce, cucumber, greek yogurt to pita - flavor with lemon juice and black pepper  . Seafood:  o Mediterranean grilled salmon, seasoned with garlic, basil, parsley, lemon juice and black pepper o Shrimp, lemon, and spinach whole-grain pasta salad made with low fat greek yogurt  o Seared scallops with lemon orzo  o Seared tuna steaks seasoned salt, pepper, coriander topped with tomato mixture of olives, tomatoes, olive oil, minced garlic, parsley,  green onions and cappers  . Meats:  o Herbed greek chicken salad with kalamata olives, cucumber, feta  o Red bell peppers stuffed with spinach, bulgur, lean ground beef (or lentils) & topped with feta   o Kebabs: skewers of chicken, tomatoes, onions, zucchini, squash  o [place] burgers: made with red onions, mint, dill, lemon juice, feta cheese topped with roasted red peppers . Vegetarian o Cucumber salad: cucumbers, artichoke hearts, celery, red onion, feta cheese, tossed in olive oil & lemon juice  o Hummus and whole grain pita points with a greek salad (lettuce, tomato, feta, olives, cucumbers, red onion) o Lentil soup with celery, carrots made with vegetable broth, garlic, salt and pepper  o Tabouli salad: parsley, bulgur, mint, scallions, cucumbers, tomato, radishes, lemon juice, olive oil, salt and pepper.         Bone Health Bones protect organs, store calcium, and anchor muscles. Good health habits, such as eating nutritious foods and exercising regularly, are important for maintaining healthy bones. They can also help to prevent a condition that causes bones to lose density and become weak and brittle (osteoporosis). WHY IS BONE MASS IMPORTANT? Bone mass refers to the amount of bone tissue that you have. The higher your bone mass, the stronger your bones. An important step toward having healthy bones throughout life is to have strong and dense bones during childhood. A young adult who has a high bone mass is more likely to have a high bone mass later in life. Bone mass at its greatest it is called peak bone mass. A large decline in bone mass occurs in older adults. In women, it occurs about the time of menopause. During this time, it is important to practice good health habits, because if more bone is lost than what is replaced, the bones will become less healthy and more likely to break (fracture). If you find that you have a low bone mass, you may be able to prevent osteoporosis or  further bone loss by changing your diet and lifestyle. HOW CAN I FIND OUT IF MY BONE MASS IS LOW? Bone mass can be measured with an X-ray test that is called a bone mineral density (BMD) test. This test is recommended for all women who are age 69 or older. It may also be recommended for men who are age 33 or older, or for people who are more likely to develop osteoporosis due to:  Having bones that break easily.  Having a long-term disease that weakens bones, such as kidney disease or rheumatoid arthritis.  Having menopause earlier than normal.  Taking medicine that weakens bones, such as steroids, thyroid hormones, or hormone treatment for breast cancer or prostate cancer.  Smoking.  Drinking three or more alcoholic drinks each day. WHAT ARE THE NUTRITIONAL RECOMMENDATIONS FOR HEALTHY BONES? To have healthy bones, you need to get enough of the  right minerals and vitamins. Most nutrition experts recommend getting these nutrients from the foods that you eat. Nutritional recommendations vary from person to person. Ask your health care provider what is healthy for you. Here are some general guidelines. Calcium Recommendations Calcium is the most important (essential) mineral for bone health. Most people can get enough calcium from their diet, but supplements may be recommended for people who are at risk for osteoporosis. Good sources of calcium include:  Dairy products, such as low-fat or nonfat milk, cheese, and yogurt.  Dark green leafy vegetables, such as bok choy and broccoli.  Calcium-fortified foods, such as orange juice, cereal, bread, soy beverages, and tofu products.  Nuts, such as almonds. Follow these recommended amounts for daily calcium intake:  Children, age 63-3: 700 mg.  Children, age 39-8: 1,000 mg.  Children, age 73-13: 1,300 mg.  Teens, age 631-18: 1,300 mg.  Adults, age 52-50: 1,000 mg.  Adults, age 67-70:  Men: 1,000 mg.  Women: 1,200 mg.  Adults, age 33 or  older: 1,200 mg.  Pregnant and breastfeeding females:  Teens: 1,300 mg.  Adults: 1,000 mg. Vitamin D Recommendations Vitamin D is the most essential vitamin for bone health. It helps the body to absorb calcium. Sunlight stimulates the skin to make vitamin D, so be sure to get enough sunlight. If you live in a cold climate or you do not get outside often, your health care provider may recommend that you take vitamin D supplements. Good sources of vitamin D in your diet include:  Egg yolks.  Saltwater fish.  Milk and cereal fortified with vitamin D. Follow these recommended amounts for daily vitamin D intake:  Children and teens, age 63-18: 600 international units.  Adults, age 639 or younger: 400-800 international units.  Adults, age 37 or older: 800-1,000 international units. Other Nutrients Other nutrients for bone health include:  Phosphorus. This mineral is found in meat, poultry, dairy foods, nuts, and legumes. The recommended daily intake for adult men and adult women is 700 mg.  Magnesium. This mineral is found in seeds, nuts, dark green vegetables, and legumes. The recommended daily intake for adult men is 400-420 mg. For adult women, it is 310-320 mg.  Vitamin K. This vitamin is found in green leafy vegetables. The recommended daily intake is 120 mg for adult men and 90 mg for adult women. WHAT TYPE OF PHYSICAL ACTIVITY IS BEST FOR BUILDING AND MAINTAINING HEALTHY BONES? Weight-bearing and strength-building activities are important for building and maintaining peak bone mass. Weight-bearing activities cause muscles and bones to work against gravity. Strength-building activities increases muscle strength that supports bones. Weight-bearing and muscle-building activities include:  Walking and hiking.  Jogging and running.  Dancing.  Gym exercises.  Lifting weights.  Tennis and racquetball.  Climbing stairs.  Aerobics. Adults should get at least 30 minutes of  moderate physical activity on most days. Children should get at least 60 minutes of moderate physical activity on most days. Ask your health care provide what type of exercise is best for you. WHERE CAN I FIND MORE INFORMATION? For more information, check out the following websites:  National Osteoporosis Foundation: http://burton-owens.org/  [employer]: http://www.niams.http://www.johnson-fowler.biz/.asp   This information is not intended to replace advice given to you by your health care provider. Make sure you discuss any questions you have with your health care provider.   Document Released: 02/12/2004 Document Revised: 04/08/2015 Document Reviewed: 11/27/2014 Elsevier Interactive Patient Education [employer]

## 2016-03-05 ENCOUNTER — Encounter: Payer: Self-pay | Admitting: Family Medicine

## 2016-03-05 ENCOUNTER — Ambulatory Visit (INDEPENDENT_AMBULATORY_CARE_PROVIDER_SITE_OTHER): Payer: BLUE CROSS/BLUE SHIELD | Admitting: Family Medicine

## 2016-03-05 VITALS — BP 102/68 | HR 91 | Temp 98.2°F | Ht 66.0 in | Wt 163.7 lb

## 2016-03-05 DIAGNOSIS — H6981 Other specified disorders of Eustachian tube, right ear: Secondary | ICD-10-CM | POA: Diagnosis not present

## 2016-03-05 DIAGNOSIS — J329 Chronic sinusitis, unspecified: Secondary | ICD-10-CM

## 2016-03-05 DIAGNOSIS — R42 Dizziness and giddiness: Secondary | ICD-10-CM

## 2016-03-05 DIAGNOSIS — J310 Chronic rhinitis: Secondary | ICD-10-CM

## 2016-03-05 LAB — POCT RAPID STREP A (OFFICE): Rapid Strep A Screen: NEGATIVE

## 2016-03-05 MED ORDER — MECLIZINE HCL 25 MG PO TABS: 25.0000 mg | ORAL_TABLET | Freq: Three times a day (TID) | ORAL | Status: DC | PRN

## 2016-03-05 MED ORDER — DOXYCYCLINE HYCLATE 100 MG PO CAPS
100.0000 mg | ORAL_CAPSULE | Freq: Two times a day (BID) | ORAL | Status: DC
Start: 2016-03-05 — End: 2016-03-16

## 2016-03-05 NOTE — Progress Notes (Signed)
HPI:  Acute visit for URI/dizziness/Sore throat: -started: the last 1 week -symptoms: sore throat, yellow phlegm, mild nausea intermittently, nosebleed, laryngitis, feels dizzy/off balance at times (was on cruise last week and was taking scopolamine, felt okay as long as taking scopolamine, now off scopolamine, still feels dizzy intermittently) -denies:fever, SOB, NVD, tooth pain -sick contacts/travel/risks: no reported flu, strep or tick exposure -Hx of: allergies  ROS: See pertinent positives and negatives per HPI.  Past Medical History  Diagnosis Date  . GERD (gastroesophageal reflux disease)     Past Surgical History  Procedure Laterality Date  . No past surgeries      Family History  Problem Relation Age of Onset  . Diabetes      grandparents  . Hypertension      grandparents and other blood relative  . Heart disease      grandparents  . Neuropathy Mother   . Sjogren's syndrome Mother     Social History   Social History  . Marital Status: Married    Spouse Name: N/A  . Number of Children: N/A  . Years of Education: N/A   Social History Main Topics  . Smoking status: Never Smoker   . Smokeless tobacco: None  . Alcohol Use: Yes     Comment: Occ.  . Drug Use: No  . Sexual Activity: Not Asked   Other Topics Concern  . None   Social History Narrative   Occupation: Psychologist, sport and exercise clinical lead person    Married   HH of 4   6-8 hours of sleep   Neg tobacco    etoh 5 per week.   caffene   No sugars  Tea.      Current outpatient prescriptions:  .  B Complex-C (SUPER B COMPLEX PO), Take by mouth., Disp: , Rfl:  .  Nutritional Supplements (JUICE PLUS FIBRE PO), Take by mouth., Disp: , Rfl:  .  doxycycline (VIBRAMYCIN) 100 MG capsule, Take 1 capsule (100 mg total) by mouth 2 (two) times daily., Disp: 10 capsule, Rfl: 0 .  meclizine (ANTIVERT) 25 MG tablet, Take 1 tablet (25 mg total) by mouth 3 (three) times daily as needed for dizziness., Disp: 30  tablet, Rfl: 0  EXAM:  Filed Vitals:   03/05/16 1426  BP: 102/68  Pulse: 91  Temp: 98.2 F (36.8 C)    Body mass index is 26.43 kg/(m^2).  GENERAL: vitals reviewed and listed above, alert, oriented, appears well hydrated and in no acute distress  HEENT: atraumatic, conjunttiva clear, no obvious abnormalities on inspection of external nose and ears, normal appearance of ear canals and TMs, clear effusion R, thick white nasal congestion R > L, mild post oropharyngeal erythema with PND, no tonsillar edema or exudate, no sinus TTP  NECK: no obvious masses on inspection  LUNGS: clear to auscultation bilaterally, no wheezes, rales or rhonchi, good air movement  CV: HRRR, no peripheral edema  MS: moves all extremities without noticeable abnormality  PSYCH: pleasant and cooperative, no obvious depression or anxiety  ASSESSMENT AND PLAN:  Discussed the following assessment and plan:  Rhinosinusitis  Dizziness  ETD (eustachian tube dysfunction), right  -suspect upper respiratory infection or allergic rhinitis as cause of eustachian tube dysfunction and other symptoms, also recovering equilibrium from recent cruise -Part nasal decongestant short course, and INS given allergies; she has trip next week and wants to get better, start antibiotic given findings on exam in case worsening or not improving as expected -she wants something for  dizziness if occurs,  Small amount of meclizine provided -of course, we advised to return or notify a doctor immediately if symptoms worsen or persist or new concerns arise.    Patient Instructions  Before you leave: Rapid strep  Start Afrin nasal spray twice daily for 5 days. Then stop.  Flonase 2 sprays each nostril for 21 days.  If not improving or worsening over the next several days please start the antibiotic.  Can use the meclizine if needed for dizziness.  Follow-up if worsening, new symptoms or you are not improving as  expected.     Kriste BasqueKIM, Semaje Kinker R.

## 2016-03-05 NOTE — Progress Notes (Signed)
Pre visit review using our clinic review tool, if applicable. No additional management support is needed unless otherwise documented below in the visit note. 

## 2016-03-05 NOTE — Addendum Note (Signed)
Addended by: Johnella MoloneyFUNDERBURK, JO A on: 03/05/2016 03:02 PM   Modules accepted: Orders

## 2016-03-05 NOTE — Patient Instructions (Addendum)
Before you leave: Rapid strep  Start Afrin nasal spray twice daily for 5 days. Then stop.  Flonase 2 sprays each nostril for 21 days.  If not improving or worsening over the next several days please start the antibiotic.  Can use the meclizine if needed for dizziness.  Follow-up if worsening, new symptoms or you are not improving as expected.

## 2016-03-16 ENCOUNTER — Telehealth: Payer: Self-pay | Admitting: Internal Medicine

## 2016-03-16 MED ORDER — DOXYCYCLINE HYCLATE 100 MG PO CAPS: 100.0000 mg | ORAL_CAPSULE | Freq: Two times a day (BID) | ORAL | Status: DC

## 2016-03-16 NOTE — Telephone Encounter (Signed)
Please call patient. It has been several weeks since we saw her. She is having classic symptoms of a mild sinus infection (thick or discolored sinus congestion, sinus discomfort, drainage, etc.) and the dizziness has resolved okay to send the antibiotic. Otherwise I advised that [place] see a [place]. Advise follow-up with her primary doctor when she returns if she has persistent symptoms.

## 2016-03-16 NOTE — Telephone Encounter (Signed)
I called the pt and informed her of the message below.  She stated it has not been weeks since she was seen and now has pressure below her eye, in the ear, sinus drainage and has been hoarse.  States she still has occasional dizziness and discussed this with Dr Selena BattenKim.  The pt is aware the Rx was sent to her pharmacy.

## 2016-03-16 NOTE — Telephone Encounter (Signed)
Pt needs the doxycycline you wrote for her called into a [facility] [place]. She is visiting her parents and did not bring the rx with her. The [facility] phone number is [phone].

## 2016-03-30 DIAGNOSIS — J302 Other seasonal allergic rhinitis: Secondary | ICD-10-CM | POA: Diagnosis not present

## 2016-03-30 DIAGNOSIS — N3000 Acute cystitis without hematuria: Secondary | ICD-10-CM | POA: Diagnosis not present

## 2016-04-09 ENCOUNTER — Other Ambulatory Visit: Payer: Self-pay

## 2016-04-09 DIAGNOSIS — Z1231 Encounter for screening mammogram for malignant neoplasm of breast: Secondary | ICD-10-CM

## 2016-05-28 ENCOUNTER — Ambulatory Visit
Admission: RE | Admit: 2016-05-28 | Discharge: 2016-05-28 | Disposition: A | Discharge status: Home / Self Care | Payer: BLUE CROSS/BLUE SHIELD | Source: Ambulatory Visit

## 2016-05-28 ENCOUNTER — Other Ambulatory Visit: Payer: Self-pay | Admitting: Obstetrics and Gynecology

## 2016-05-28 DIAGNOSIS — Z1231 Encounter for screening mammogram for malignant neoplasm of breast: Secondary | ICD-10-CM | POA: Diagnosis not present

## 2016-08-02 DIAGNOSIS — Z01419 Encounter for gynecological examination (general) (routine) without abnormal findings: Secondary | ICD-10-CM | POA: Diagnosis not present

## 2016-08-02 DIAGNOSIS — Z6825 Body mass index (BMI) 25.0-25.9, adult: Secondary | ICD-10-CM | POA: Diagnosis not present

## 2016-08-20 LAB — HEPATIC FUNCTION PANEL
ALK PHOS: 53 U/L (ref 25–125)
ALT: 64 U/L — AB (ref 7–35)
AST: 44 U/L — AB (ref 13–35)
BILIRUBIN, TOTAL: 0.4 mg/dL

## 2016-08-20 LAB — LIPID PANEL
Cholesterol: 139 mg/dL (ref 0–200)
HDL: 48 mg/dL (ref 35–70)
LDL Cholesterol: 73 mg/dL
TRIGLYCERIDES: 96 mg/dL (ref 40–160)

## 2016-08-20 LAB — BASIC METABOLIC PANEL
CREATININE: 0.7 mg/dL (ref <=1.1)
GLUCOSE: 83 mg/dL

## 2016-08-20 LAB — TSH: TSH: 2.9 u[IU]/mL (ref <=5.90)

## 2016-08-20 LAB — HEMOGLOBIN A1C: HEMOGLOBIN A1C: 5.3

## 2016-09-01 ENCOUNTER — Telehealth: Payer: Self-pay | Admitting: Internal Medicine

## 2016-09-01 NOTE — Telephone Encounter (Signed)
Pt would like to have CPE before the end of October can not come on a Wednesday prefer to come on a Friday.   Can I put two SD's together?    Pt had lab work done and they gave her the results and the liver is 3 times more than normal and they are her annual labs that she normally have done for her CPE.

## 2016-09-02 NOTE — Telephone Encounter (Signed)
Called pt to schedule but the pts voicemail was not accepting calls.

## 2016-09-02 NOTE — Telephone Encounter (Signed)
Ok to do this please  Haver her get us  the lab results  for review before she comes for the visit  . WP

## 2016-09-03 NOTE — Telephone Encounter (Signed)
Pt has been sch and will fax order labs

## 2016-09-10 NOTE — Progress Notes (Signed)
Pre visit review using our clinic review tool, if applicable. No additional management support is needed unless otherwise documented below in the visit note.  Chief Complaint  Patient presents with  . Annual Exam    HPI: Patient  Valerie Mcdowell  50 y.o. comes in today for Preventive Health Care visit  And fu abnormal labs done at work screening  Had abn lfts on screen without sx or  Cause  Done med September  No hx of same  No tylenol no help Takes advil  About 1-2 per week .    Has or shoulder  Ms.  Juice plus.   Supplements .   Is  Dehydrated  Foods .probiotics   b vits oscal   And   etoh 2  Per night th fri sat   Wine and vodka type beverages  Is menopausal lmp 2016 Had c spine mri bone spurs  Has hx of falls trips weh doing something else bruises  Not serios  Goes to [employer]  Topic Date Due  . COLONOSCOPY  02/25/2015  . HIV Screening  09/12/2017 (Originally 02/25/1981)  . MAMMOGRAM  05/28/2017  . PAP SMEAR  08/03/2019  . TETANUS/TDAP  10/25/2023  . INFLUENZA VACCINE  Addressed   Health Maintenance Review LIFESTYLE:  Exercise:  no Tobacco/ETS:  no Alcohol:  Red wine and vodka   Per week  thurs  per Sunday  Sugar beverages: no Sleep: 6 hours Drug use: no HH of  2  Work:  36 hours      ROS: right haded am hand stiffness and discomfort usually related to effort day before no redness warmth   fatighe with interrupted sleep  GEN/ HEENT: No fever, significant weight changes sweats headaches vision problems hearing changes, CV/ PULM; No chest pain shortness of breath cough, syncope,edema  change in exercise tolerance. GI /GU: No adominal pain, vomiting, change in bowel habits. No blood in the stool. No significant GU symptoms. SKIN/HEME: ,no acute skin rashes suspicious lesions or bleeding. No lymphadenopathy, nodules, masses.  NEURO/ PSYCH:  No neurologic signs such as weakness numbness. No depression anxiety. IMM/ Allergy: No unusual infections.  Allergy .    REST of 12 system review negative except as per HPI   Past Medical History:  Diagnosis Date  . GERD (gastroesophageal reflux disease)     Past Surgical History:  Procedure Laterality Date  . NO PAST SURGERIES      Family History  Problem Relation Age of Onset  . Diabetes      grandparents  . Hypertension      grandparents and other blood relative  . Heart disease      grandparents  . Neuropathy Mother   . Sjogren's syndrome Mother     Social History   Social History  . Marital status: Married    Spouse name: N/A  . Number of children: N/A  . Years of education: N/A   Social History Main Topics  . Smoking status: Never Smoker  . Smokeless tobacco: Never Used  . Alcohol use Yes     Comment: Occ.  . Drug use: No  . Sexual activity: Not Asked   Other Topics Concern  . None   Social History Narrative   Occupation: Psychologist, sport and exercise clinical lead person    Married   HH of 4   6-8 hours of sleep   Neg tobacco    etoh 5 per week.   caffene   No sugars  Tea.     Outpatient Medications Prior to Visit  Medication Sig Dispense Refill  . B Complex-C (SUPER B COMPLEX PO) Take by mouth.    . Nutritional Supplements (JUICE PLUS FIBRE PO) Take by mouth.    . doxycycline (VIBRAMYCIN) 100 MG capsule Take 1 capsule (100 mg total) by mouth 2 (two) times daily. 10 capsule 0  . meclizine (ANTIVERT) 25 MG tablet Take 1 tablet (25 mg total) by mouth 3 (three) times daily as needed for dizziness. 30 tablet 0   No facility-administered medications prior to visit.      EXAM:  BP 114/76 (BP Location: Right Arm, Patient Position: Sitting, Cuff Size: Normal)   Temp 98.3 F (36.8 C) (Oral)   Ht 5' 6.25" (1.683 m)   Wt 158 lb 12.8 oz (72 kg)   LMP 12/06/2014 (Within Days)   BMI 25.44 kg/m   Body mass index is 25.44 kg/m.  Physical Exam: Vital signs reviewed WUJ:WJXBGEN:This is a well-developed well-nourished alert cooperative    who appearr stated age in no acute  distress.  HEENT: normocephalic atraumatic , Eyes: PERRL EOM's full, conjunctiva clear, Nares: paten,t no deformity discharge or tenderness., Ears: no deformity EAC's clear TMs with normal landmarks. Mouth: clear OP, no lesions, edema.  Moist mucous membranes. Dentition in adequate repair. NECK: supple without masses, thyromegaly or bruits. CHEST/PULM:  Clear to auscultation and percussion breath sounds equal no wheeze , rales or rhonchi. No chest wall deformities or tenderness. CV: PMI is nondisplaced, S1 S2 no gallops, murmurs, rubs. Peripheral pulses are full without delay.No JVD .  ABDOMEN: Bowel sounds normal nontender  No guard or rebound, no hepato splenomegal no CVA tenderness.  No hernia. Extremtities:  No clubbing cyanosis or edema, no acute joint swelling or redness no focal atrophy NEURO:  Oriented x3, cranial nerves 3-12 appear to be intact, no obvious focal weakness,gait within normal limits no abnormal reflexes or asymmetrical SKIN: No acute rashes normal turgor, color, no  Petechiae.  Faded bruise shin  PSYCH: Oriented, good eye contact, no obvious depression anxiety, cognition and judgment appear normal. LN: no cervical axillary inguinal adenopathy  Lab Results  Component Value Date   HGB 12.6 08/27/2011   BP Readings from Last 3 Encounters:  09/13/16 114/76  03/05/16 102/68  09/29/15 110/74   Wt Readings from Last 3 Encounters:  09/13/16 158 lb 12.8 oz (72 kg)  03/05/16 163 lb 11.2 oz (74.3 kg)  09/29/15 163 lb 1.6 oz (74 kg)  ast 44 hscrp 5.5 alt 64 ggt nl a1c 5.3 tc 139 hdl 48 ldl 73  Ferritin 109sat 20  ASSESSMENT AND PLAN:  Discussed the following assessment and plan:  Visit for preventive health examination  Abnormal LFTs - on routine screening  per job.   Elevated C-reactive protein (CRP)  Colon cancer screening - disc optinos  will do cologuard   Muscular aches Stop all supplements  Plan  More labs hep b hep c  (Ferritin ibc was nl ) ? Ana  Cause  of  joint aches in am  Could be overuse .  Exercise may help avoid falls  [facility]abs in [facility]  And depending on results plan fu  Patient Care Team: Madelin HeadingsWanda K Traeton Bordas, MD as PCP - General Ailene ArdsJacqueline Mims, MD as Referring Physician (Obstetrics and Gynecology) Cherlyn RobertsFrederick Lupton, MD as Consulting Physician (Dermatology) Patient Instructions  For now   Stop all supplements... vitamins   Alcohol limited ntake for the next 3-4 weeks . Plan lab  work in a month with serology  tests   As we discussed . Depending on the results  Will make further plan .   I consider ultrasound other evaluation. In adequate sleep indeed can make you tired.   Your exam is reassuring .  Advise increase exercise to include balance exercises   . This may help prevent falls  And help your energy and sleep.  Check with insurance if coveres COLOguard and we can send in  The order.      Neta Mends. Valerie Mcdowell M.D.

## 2016-09-13 ENCOUNTER — Encounter: Payer: Self-pay | Admitting: Internal Medicine

## 2016-09-13 ENCOUNTER — Ambulatory Visit (INDEPENDENT_AMBULATORY_CARE_PROVIDER_SITE_OTHER): Payer: BLUE CROSS/BLUE SHIELD | Admitting: Internal Medicine

## 2016-09-13 VITALS — BP 114/76 | Temp 98.3°F | Ht 66.25 in | Wt 158.8 lb

## 2016-09-13 DIAGNOSIS — Z0000 Encounter for general adult medical examination without abnormal findings: Secondary | ICD-10-CM | POA: Diagnosis not present

## 2016-09-13 DIAGNOSIS — R7982 Elevated C-reactive protein (CRP): Secondary | ICD-10-CM

## 2016-09-13 DIAGNOSIS — Z1211 Encounter for screening for malignant neoplasm of colon: Secondary | ICD-10-CM

## 2016-09-13 DIAGNOSIS — M791 Myalgia, unspecified site: Secondary | ICD-10-CM

## 2016-09-13 DIAGNOSIS — R7989 Other specified abnormal findings of blood chemistry: Secondary | ICD-10-CM

## 2016-09-13 DIAGNOSIS — R945 Abnormal results of liver function studies: Secondary | ICD-10-CM

## 2016-09-13 NOTE — Patient Instructions (Addendum)
For now   Stop all supplements... vitamins   Alcohol limited ntake for the next 3-4 weeks . Plan lab work in a month with serology  tests   As we discussed . Depending on the results  Will make further plan .   I consider ultrasound other evaluation. In adequate sleep indeed can make you tired.   Your exam is reassuring .  Advise increase exercise to include balance exercises   . This may help prevent falls  And help your energy and sleep.  Check with insurance if coveres COLOguard and we can send in  The order.

## 2016-09-23 ENCOUNTER — Encounter: Payer: Self-pay | Admitting: Family Medicine

## 2016-10-06 ENCOUNTER — Encounter: Payer: Self-pay | Admitting: Internal Medicine

## 2016-10-08 ENCOUNTER — Encounter: Payer: Self-pay | Admitting: Internal Medicine

## 2016-10-08 DIAGNOSIS — R7982 Elevated C-reactive protein (CRP): Secondary | ICD-10-CM | POA: Diagnosis not present

## 2016-10-08 DIAGNOSIS — Z1212 Encounter for screening for malignant neoplasm of rectum: Secondary | ICD-10-CM | POA: Diagnosis not present

## 2016-10-08 DIAGNOSIS — Z1211 Encounter for screening for malignant neoplasm of colon: Secondary | ICD-10-CM | POA: Diagnosis not present

## 2016-10-08 DIAGNOSIS — R7989 Other specified abnormal findings of blood chemistry: Secondary | ICD-10-CM | POA: Diagnosis not present

## 2016-10-08 NOTE — Telephone Encounter (Signed)
I dont have a copy of the written scrip  Check to see if it says CBCdiff   Although that was not the focus of the  Lab tests ( liver and iron studies most important)  I had originally  Planned on  [facility] orders but didn't place them in our system  Misty please  Help ascertain  Where she is going for blood tests   And if she has a written scribp  As I didn't write it down but thought I remember she has to use a different reference lab.

## 2016-10-13 ENCOUNTER — Telehealth: Payer: Self-pay | Admitting: Internal Medicine

## 2016-10-13 DIAGNOSIS — R945 Abnormal results of liver function studies: Secondary | ICD-10-CM

## 2016-10-13 DIAGNOSIS — R768 Other specified abnormal immunological findings in serum: Secondary | ICD-10-CM

## 2016-10-13 DIAGNOSIS — R52 Pain, unspecified: Secondary | ICD-10-CM

## 2016-10-13 DIAGNOSIS — R7989 Other specified abnormal findings of blood chemistry: Secondary | ICD-10-CM

## 2016-10-13 NOTE — Telephone Encounter (Signed)
Spoke to the pt and informed her that I have not received lab results.  Pt states she had them on Friday.  She will call [facility] and see if the results are back.  Gave her back fax line to send results.

## 2016-10-13 NOTE — Telephone Encounter (Signed)
Misty can you see if the results are in the paper world sent to us?

## 2016-10-13 NOTE — Telephone Encounter (Signed)
Pt would like a call back about her lab results

## 2016-10-14 ENCOUNTER — Telehealth: Payer: Self-pay | Admitting: Internal Medicine

## 2016-10-14 NOTE — Telephone Encounter (Addendum)
Pt called back and left a message on my machine.  She would like a copy of the results to be placed at the front desk for her to pick up.  Spoke to her husband and he sees a rheumatologist for his knees.  He sees Dr. Orlin HildingAngela Hawks.  Would like to know if ok to see her.  She did call the office and will need a referral and a copy of the labs sent over.  Also has agreed to proceed with the liver ultrasound.  Order placed in the system.  [facility] to [facility] Dr. Lendon ColonelHawks.

## 2016-10-14 NOTE — Telephone Encounter (Signed)
Spoke to the pt and informed her of message. At this time she is going to speak to her husband and will call back with what she wants to do.  Is going out of town tomorrow.  Pt stated it could be Monday before she call back.  Informed pt that I will keep her message active and to call back when she is able.

## 2016-10-14 NOTE — Telephone Encounter (Signed)
Valerie Mcdowell pt returned your call in reference to a paper copy of her labs and the referral information.

## 2016-10-14 NOTE — Telephone Encounter (Signed)
Liver tests about the same ( mildly elevated ) 45/55 ast /alt  hepatitis  b and c screen tests negative  ANA mildly positive  Sometimes associated with autoimmune cause of joint aches and liver inflammation.   Order liver ultrasound   (Dx  Abnormal lfts )( expect to be normal)   Refer to   GI  For abn lfts     Refer rheumatology ( dr Durenda Agedeveshewar)   For pos ana joint aches  Elevated crp To  Evaluate further   If she wants to discuss further can make OV to go over all of this

## 2016-10-14 NOTE — Addendum Note (Signed)
Addended by: Raj JanusADKINS, Fredrica Capano T on: 10/14/2016 02:26 PM   Modules accepted: Orders

## 2016-10-14 NOTE — Telephone Encounter (Signed)
Received results from fax.  Will give to Dr. Demetrius CharityP for review and will then call the pt.

## 2016-10-14 NOTE — Telephone Encounter (Signed)
Duplicate Message.  See other telephone note.

## 2016-10-14 NOTE — Telephone Encounter (Signed)
[name] routed conversation to You 43 minutes ago (11:09 AM)    Sunday SpillersLawana T Djimraou 44 minutes ago (11:07 AM)    Valerie Mcdowell pt returned your call in reference to a paper copy of her labs and the referral information.

## 2016-10-15 DIAGNOSIS — R768 Other specified abnormal immunological findings in serum: Secondary | ICD-10-CM | POA: Insufficient documentation

## 2016-10-15 LAB — COLOGUARD: COLOGUARD: NEGATIVE

## 2016-10-15 NOTE — Addendum Note (Signed)
Addended by: Raj JanusADKINS, Kashish Yglesias T on: 10/15/2016 11:33 AM   Modules accepted: Orders

## 2016-10-15 NOTE — Telephone Encounter (Signed)
Agree  Get patient copy  Ok to refer to dr Nickola Majorhawkes  As requested  Dx  Body aches abnormal lfts and  Pos ana

## 2016-10-15 NOTE — Telephone Encounter (Signed)
Spoke to the pt and informed her that Dr. Demetrius CharityP agreed to referral to Dr. Lendon ColonelHawks.  Referral placed in the system.

## 2016-10-26 ENCOUNTER — Telehealth: Payer: Self-pay | Admitting: Internal Medicine

## 2016-10-26 NOTE — Telephone Encounter (Signed)
Pt need a referral to see a Rheumatologist one was sent to Dr. Lendon ColonelHawks but the office has not gotten in touch with her to make an appointment and would like to go forward with who Dr. Fabian SharpPanosh has referred her to.

## 2016-10-26 NOTE — Telephone Encounter (Signed)
ANA lab work has not been scanned into her chart.  Spoke to the pt earlier.  She has a copy and will take it to Dr. Nickola MajorHawkes office if necessary.  Advised that she can also fax to me and I will send.  Will contact the pt in the morning.  Did try to call her before I left today to inform her that copy has not been scanned in.

## 2016-10-26 NOTE — Telephone Encounter (Signed)
Called over to Dr. Nickola MajorHawkes office and spoke to receptionist.  Was told the only item needed to complete referral is patient's recent lab work.  Sent by fax.  Left a message on identified voicemail informing the pt of above information and that she should be getting a call to schedule appointment.  Advised that if we start a new referral than will have to start the process over again.  Asked that she give me a call back.  Waiting on call.

## 2016-10-26 NOTE — Telephone Encounter (Signed)
Pt states the dr Nickola Majorhawkes office called to advise they did not get the correct lab results. They need the ANA positive

## 2016-10-27 NOTE — Telephone Encounter (Signed)
Lab now scanned in.  Left a message on identified voicemail informing her that I will fax on Monday.  Will need to call Dr. Nickola MajorHawkes office on Monday to get fax number.

## 2016-11-02 NOTE — Telephone Encounter (Signed)
Spoke to Hopeindy and Dr. Nickola MajorHawkes office and the pt is scheduled for 11/03/16 @ 8:30.  I asked Arline AspCindy if they have all the lab work needed and she said, "I guess or they would not have scheduled her for appt."  Will now close the note.

## 2016-11-03 DIAGNOSIS — Z6826 Body mass index (BMI) 26.0-26.9, adult: Secondary | ICD-10-CM | POA: Diagnosis not present

## 2016-11-03 DIAGNOSIS — E663 Overweight: Secondary | ICD-10-CM | POA: Diagnosis not present

## 2016-11-03 DIAGNOSIS — R768 Other specified abnormal immunological findings in serum: Secondary | ICD-10-CM | POA: Diagnosis not present

## 2016-11-03 DIAGNOSIS — R7989 Other specified abnormal findings of blood chemistry: Secondary | ICD-10-CM | POA: Diagnosis not present

## 2016-11-05 ENCOUNTER — Other Ambulatory Visit: Payer: BLUE CROSS/BLUE SHIELD

## 2016-11-09 DIAGNOSIS — R7989 Other specified abnormal findings of blood chemistry: Secondary | ICD-10-CM | POA: Diagnosis not present

## 2016-11-09 DIAGNOSIS — Z6825 Body mass index (BMI) 25.0-25.9, adult: Secondary | ICD-10-CM | POA: Diagnosis not present

## 2016-11-09 DIAGNOSIS — R768 Other specified abnormal immunological findings in serum: Secondary | ICD-10-CM | POA: Diagnosis not present

## 2016-11-09 DIAGNOSIS — E663 Overweight: Secondary | ICD-10-CM | POA: Diagnosis not present

## 2016-11-16 DIAGNOSIS — J00 Acute nasopharyngitis [common cold]: Secondary | ICD-10-CM | POA: Diagnosis not present

## 2016-11-16 DIAGNOSIS — J029 Acute pharyngitis, unspecified: Secondary | ICD-10-CM | POA: Diagnosis not present

## 2016-11-16 DIAGNOSIS — J069 Acute upper respiratory infection, unspecified: Secondary | ICD-10-CM | POA: Diagnosis not present

## 2016-12-24 ENCOUNTER — Ambulatory Visit
Admission: RE | Admit: 2016-12-24 | Discharge: 2016-12-24 | Disposition: A | Discharge status: Home / Self Care | Payer: BLUE CROSS/BLUE SHIELD | Source: Ambulatory Visit | Attending: Internal Medicine | Admitting: Internal Medicine

## 2016-12-24 ENCOUNTER — Other Ambulatory Visit: Payer: BLUE CROSS/BLUE SHIELD

## 2016-12-24 DIAGNOSIS — R945 Abnormal results of liver function studies: Secondary | ICD-10-CM | POA: Diagnosis not present

## 2016-12-24 DIAGNOSIS — R7989 Other specified abnormal findings of blood chemistry: Secondary | ICD-10-CM

## 2016-12-28 ENCOUNTER — Telehealth: Payer: Self-pay | Admitting: Internal Medicine

## 2016-12-28 NOTE — Telephone Encounter (Signed)
Pt had imaging done Friday and would like results .please try work number first, then cell.

## 2016-12-28 NOTE — Telephone Encounter (Signed)
Please see result note 

## 2016-12-28 NOTE — Telephone Encounter (Signed)
Noted  

## 2017-02-01 DIAGNOSIS — N3000 Acute cystitis without hematuria: Secondary | ICD-10-CM | POA: Diagnosis not present

## 2017-04-29 ENCOUNTER — Other Ambulatory Visit: Payer: Self-pay | Admitting: Obstetrics and Gynecology

## 2017-04-29 DIAGNOSIS — Z1231 Encounter for screening mammogram for malignant neoplasm of breast: Secondary | ICD-10-CM

## 2017-06-17 ENCOUNTER — Ambulatory Visit
Admission: RE | Admit: 2017-06-17 | Discharge: 2017-06-17 | Disposition: A | Discharge status: Home / Self Care | Payer: BLUE CROSS/BLUE SHIELD | Source: Ambulatory Visit | Attending: Obstetrics and Gynecology | Admitting: Obstetrics and Gynecology

## 2017-06-17 DIAGNOSIS — Z1231 Encounter for screening mammogram for malignant neoplasm of breast: Secondary | ICD-10-CM

## 2017-08-05 DIAGNOSIS — Z01419 Encounter for gynecological examination (general) (routine) without abnormal findings: Secondary | ICD-10-CM | POA: Diagnosis not present

## 2017-08-05 DIAGNOSIS — Z6826 Body mass index (BMI) 26.0-26.9, adult: Secondary | ICD-10-CM | POA: Diagnosis not present

## 2017-08-26 ENCOUNTER — Encounter: Payer: Self-pay | Admitting: Internal Medicine

## 2017-09-02 LAB — LIPID PANEL
Cholesterol: 151 (ref 0–200)
HDL: 39 (ref 35–70)
LDL CALC: 92
LDL/HDL RATIO: 3.9
TRIGLYCERIDES: 107 (ref 40–160)

## 2017-09-02 LAB — HEPATIC FUNCTION PANEL
ALK PHOS: 60 (ref 25–125)
AST: 43 — AB (ref 13–35)
AST: 57 — AB (ref 13–35)
Bilirubin, Total: 0.5

## 2017-09-02 LAB — BASIC METABOLIC PANEL
Creatinine: 0.7 (ref 0.5–1.1)
Glucose: 94

## 2017-09-02 LAB — HEMOGLOBIN A1C: Hemoglobin A1C: 5.3

## 2017-09-02 LAB — TSH: TSH: 2.31 (ref 0.41–5.90)

## 2017-10-26 NOTE — Progress Notes (Signed)
Chief Complaint  Patient presents with  . Annual Exam    Denies any new concerns    HPI: Patient  Valerie Mcdowell  51 y.o. comes in today for [place] visit  Has lab reports from screening  Sees gyne Still has  Abnormal lfts   Saw dr Trudie Reed last year and advised to be seen yearly.had pos sjo and ana  But not felt to be inflammatory or AI hepatitis      Health Maintenance  Topic Date Due  . HIV Screening  02/25/1980  . COLONOSCOPY  02/25/2015  . MAMMOGRAM  06/17/2018  . PAP SMEAR  08/06/2020  . TETANUS/TDAP  10/25/2023  . INFLUENZA VACCINE  Completed   Health Maintenance Review LIFESTYLE:  Exercise:    Very little  Sitting  More   Tobacco/ETS: no Alcohol:   Red wine  3 d per week  Sugar beverages: ocass  Sleep: about 6-7  Drug use: no HH of  2 no pets  Grad dogs  Work:   77  .   Some acid reflux  ocass use of otc.  Spicy and fired foods . Takes med prn  No dysphagia    ROS:  GEN/ HEENT: No fever, significant weight changes sweats headaches vision problems hearing changes, CV/ PULM; No chest pain shortness of breath cough, syncope,edema  change in exercise tolerance. GI /GU: No adominal pain, vomiting, change in bowel habits. No blood in the stool. No significant GU symptoms. SKIN/HEME: ,no acute skin rashes suspicious lesions or bleeding. No lymphadenopathy, nodules, masses.  NEURO/ PSYCH:  No neurologic signs such as weakness numbness. No depression anxiety. IMM/ Allergy: No unusual infections.  Allergy .   REST of 12 system review negative except as per HPI   Past Medical History:  Diagnosis Date  . GERD (gastroesophageal reflux disease)     Past Surgical History:  Procedure Laterality Date  . NO PAST SURGERIES      Family History  Problem Relation Age of Onset  . Diabetes Unknown        grandparents  . Hypertension Unknown        grandparents and other blood relative  . Heart disease Unknown        grandparents  . Neuropathy Mother   .  Sjogren's syndrome Mother     Social History   Socioeconomic History  . Marital status: Married    Spouse name: None  . Number of children: None  . Years of education: None  . Highest education level: None  Social Needs  . Financial resource strain: None  . Food insecurity - worry: None  . Food insecurity - inability: None  . Transportation needs - medical: None  . Transportation needs - non-medical: None  Occupational History  . None  Tobacco Use  . Smoking status: Never Smoker  . Smokeless tobacco: Never Used  Substance and Sexual Activity  . Alcohol use: Yes    Comment: Occ.  . Drug use: No  . Sexual activity: None  Other Topics Concern  . None  Social History Narrative   Occupation: Arts administrator clinical lead person    Married   HH of 4   6-8 hours of sleep   Neg tobacco    etoh 5 per week.   caffene   No sugars  Tea.     Outpatient Medications Prior to Visit  Medication Sig Dispense Refill  . B Complex-C (SUPER B COMPLEX PO) Take by mouth.    Marland Kitchen  Calcium Carbonate-Vitamin D (OSCAL 500/200 D-3 PO) Take by mouth.    . Nutritional Supplements (JUICE PLUS FIBRE PO) Take by mouth.    . Probiotic Product (PROBIOTIC-10 PO) Take by mouth.     No facility-administered medications prior to visit.      EXAM:  BP 110/78 (BP Location: Left Arm, Patient Position: Sitting, Cuff Size: Normal)   Pulse (!) 56   Temp 97.6 F (36.4 C) (Oral)   Ht 5' 5.75" (1.67 m)   Wt 160 lb 12.8 oz (72.9 kg)   LMP 12/06/2014 (Within Days)   BMI 26.15 kg/m   Body mass index is 26.15 kg/m. Wt Readings from Last 3 Encounters:  11/01/17 160 lb 12.8 oz (72.9 kg)  09/13/16 158 lb 12.8 oz (72 kg)  03/05/16 163 lb 11.2 oz (74.3 kg)    Physical Exam: Vital signs reviewed BSJ:GGEZ is a well-developed well-nourished alert cooperative    who appearsr stated age in no acute distress.  HEENT: normocephalic atraumatic , Eyes: PERRL EOM's full, conjunctiva clear, Nares: paten,t no  deformity discharge or tenderness., Ears: no deformity EAC's clear TMs with normal landmarks. Mouth: clear OP, no lesions, edema.  Moist mucous membranes. Dentition in adequate repair. NECK: supple without masses, thyromegaly or bruits. CHEST/PULM:  Clear to auscultation and percussion breath sounds equal no wheeze , rales or rhonchi. No chest wall deformities or tenderness. Breast: normal by inspection . No dimpling, discharge, masses, tenderness or discharge . CV: PMI is nondisplaced, S1 S2 no gallops, murmurs, rubs. Peripheral pulses are full without delay.No JVD .  ABDOMEN: Bowel sounds normal nontender  No guard or rebound, no hepato splenomegal no CVA tenderness.   Extremtities:  No clubbing cyanosis or edema, no acute joint swelling or redness no focal atrophy NEURO:  Oriented x3, cranial nerves 3-12 appear to be intact, no obvious focal weakness,gait within normal limits no abnormal reflexes or asymmetrical SKIN: No acute rashes normal turgor, color, no bruising or petechiae. PSYCH: Oriented, good eye contact, no obvious depression anxiety, cognition and judgment appear normal. LN: no cervical axillary inguinal adenopathy  Lab Results  Component Value Date   HGB 12.6 08/27/2011   CHOL 139 08/20/2016   TRIG 96 08/20/2016   HDL 48 08/20/2016   LDLCALC 73 08/20/2016   ALT 64 (A) 08/20/2016   AST 44 (A) 08/20/2016   CREATININE 0.7 08/20/2016   TSH 2.90 08/20/2016   HGBA1C 5.3 08/20/2016    BP Readings from Last 3 Encounters:  11/01/17 110/78  09/13/16 114/76  03/05/16 102/68    Lab results reviewed with patient  That she brought results  Alt 57 ast 43 hdl 39 a1c 5.3 and fbs 94 Hs crp 4  Sent to scan and abstract  ASSESSMENT AND PLAN:  Discussed the following assessment and plan:  Visit for preventive health examination  Abnormal LFTs  Positive ANA (antinuclear antibody)  Elevated C-reactive protein (CRP)  Low HDL (under 40)  Hepatic steatosis  by ultrasound 1 18 -  presumed cause of  abn lfts  lsi and follow at this time rx given to patient   For fu lfts when she next has blood drawn  A hard stick  Patient Care Team: Burnis Medin, MD as PCP - Dorothy Spark, MD as Referring Physician (Obstetrics and Gynecology) Druscilla Brownie, MD as Consulting Physician (Dermatology) Gavin Pound, MD as Consulting Physician (Rheumatology) Patient Instructions    Your exam is good today . Goal lose 3-5 pounds   Avoid sugar  and processed carbs    Activity exercise and resistance training . More plant based foods .   Keep fu with dr Trudie Reed   Get lfts at you next lab test.   avoid  Alcohol   And   Limit Tylenol advil aleve products   That irritate the liver but  I think   Diet  And exercise is the best   For now.  Will review record for any other advice .  Preventive Care 40-64 Years, Female Preventive care refers to lifestyle choices and visits with your health care provider that can promote health and wellness. What does preventive care include?  A yearly physical exam. This is also called an annual well check.  Dental exams once or twice a year.  Routine eye exams. Ask your health care provider how often you should have your eyes checked.  Personal lifestyle choices, including: ? Daily care of your teeth and gums. ? Regular physical activity. ? Eating a healthy diet. ? Avoiding tobacco and drug use. ? Limiting alcohol use. ? Practicing safe sex. ? Taking low-dose aspirin daily starting at age 59. ? Taking vitamin and mineral supplements as recommended by your health care provider. What happens during an annual well check? The services and screenings done by your health care provider during your annual well check will depend on your age, overall health, lifestyle risk factors, and family history of disease. Counseling Your health care provider may ask you questions about your:  Alcohol use.  Tobacco use.  Drug use.  Emotional  well-being.  Home and relationship well-being.  Sexual activity.  Eating habits.  Work and work Statistician.  Method of birth control.  Menstrual cycle.  Pregnancy history.  Screening You may have the following tests or measurements:  Height, weight, and BMI.  Blood pressure.  Lipid and cholesterol levels. These may be checked every 5 years, or more frequently if you are over 61 years old.  Skin check.  Lung cancer screening. You may have this screening every year starting at age 69 if you have a 30-pack-year history of smoking and currently smoke or have quit within the past 15 years.  Fecal occult blood test (FOBT) of the stool. You may have this test every year starting at age 67.  Flexible sigmoidoscopy or colonoscopy. You may have a sigmoidoscopy every 5 years or a colonoscopy every 10 years starting at age 17.  Hepatitis C blood test.  Hepatitis B blood test.  Sexually transmitted disease (STD) testing.  Diabetes screening. This is done by checking your blood sugar (glucose) after you have not eaten for a while (fasting). You may have this done every 1-3 years.  Mammogram. This may be done every 1-2 years. Talk to your health care provider about when you should start having regular mammograms. This may depend on whether you have a family history of breast cancer.  BRCA-related cancer screening. This may be done if you have a family history of breast, ovarian, tubal, or peritoneal cancers.  Pelvic exam and Pap test. This may be done every 3 years starting at age 73. Starting at age 27, this may be done every 5 years if you have a Pap test in combination with an HPV test.  Bone density scan. This is done to screen for osteoporosis. You may have this scan if you are at high risk for osteoporosis.  Discuss your test results, treatment options, and if necessary, the need for more tests with your health care provider.  Vaccines Your health care provider may recommend  certain vaccines, such as:  Influenza vaccine. This is recommended every year.  Tetanus, diphtheria, and acellular pertussis (Tdap, Td) vaccine. You may need a Td booster every 10 years.  Varicella vaccine. You may need this if you have not been vaccinated.  Zoster vaccine. You may need this after age 87.  Measles, mumps, and rubella (MMR) vaccine. You may need at least one dose of MMR if you were born in 1957 or later. You may also need a second dose.  Pneumococcal 13-valent conjugate (PCV13) vaccine. You may need this if you have certain conditions and were not previously vaccinated.  Pneumococcal polysaccharide (PPSV23) vaccine. You may need one or two doses if you smoke cigarettes or if you have certain conditions.  Meningococcal vaccine. You may need this if you have certain conditions.  Hepatitis A vaccine. You may need this if you have certain conditions or if you travel or work in places where you may be exposed to hepatitis A.  Hepatitis B vaccine. You may need this if you have certain conditions or if you travel or work in places where you may be exposed to hepatitis B.  Haemophilus influenzae type b (Hib) vaccine. You may need this if you have certain conditions.  Talk to your health care provider about which screenings and vaccines you need and how often you need them. This information is not intended to replace advice given to you by your health care provider. Make sure you discuss any questions you have with your health care provider. Document Released: 12/19/2015 Document Revised: 08/11/2016 Document Reviewed: 09/23/2015 Elsevier Interactive Patient Education  2017 [place]. Cynithia Hakimi M.D.

## 2017-11-01 ENCOUNTER — Encounter: Payer: Self-pay | Admitting: Internal Medicine

## 2017-11-01 ENCOUNTER — Ambulatory Visit (INDEPENDENT_AMBULATORY_CARE_PROVIDER_SITE_OTHER): Payer: BLUE CROSS/BLUE SHIELD | Admitting: Internal Medicine

## 2017-11-01 VITALS — BP 110/78 | HR 56 | Temp 97.6°F | Ht 65.75 in | Wt 160.8 lb

## 2017-11-01 DIAGNOSIS — R768 Other specified abnormal immunological findings in serum: Secondary | ICD-10-CM | POA: Diagnosis not present

## 2017-11-01 DIAGNOSIS — R7989 Other specified abnormal findings of blood chemistry: Secondary | ICD-10-CM

## 2017-11-01 DIAGNOSIS — R945 Abnormal results of liver function studies: Secondary | ICD-10-CM | POA: Diagnosis not present

## 2017-11-01 DIAGNOSIS — Z0000 Encounter for general adult medical examination without abnormal findings: Secondary | ICD-10-CM | POA: Diagnosis not present

## 2017-11-01 DIAGNOSIS — R7982 Elevated C-reactive protein (CRP): Secondary | ICD-10-CM

## 2017-11-01 DIAGNOSIS — E786 Lipoprotein deficiency: Secondary | ICD-10-CM | POA: Diagnosis not present

## 2017-11-01 DIAGNOSIS — K760 Fatty (change of) liver, not elsewhere classified: Secondary | ICD-10-CM

## 2017-11-01 NOTE — Patient Instructions (Addendum)
Your exam is good today . Goal lose 3-5 pounds   Avoid sugar and processed carbs    Activity exercise and resistance training . More plant based foods .   Keep fu with dr Trudie Reed   Get lfts at you next lab test.   avoid  Alcohol   And   Limit Tylenol advil aleve products   That irritate the liver but  I think   Diet  And exercise is the best   For now.  Will review record for any other advice .  Preventive Care 40-64 Years, Female Preventive care refers to lifestyle choices and visits with your health care provider that can promote health and wellness. What does preventive care include?  A yearly physical exam. This is also called an annual well check.  Dental exams once or twice a year.  Routine eye exams. Ask your health care provider how often you should have your eyes checked.  Personal lifestyle choices, including: ? Daily care of your teeth and gums. ? Regular physical activity. ? Eating a healthy diet. ? Avoiding tobacco and drug use. ? Limiting alcohol use. ? Practicing safe sex. ? Taking low-dose aspirin daily starting at age 52. ? Taking vitamin and mineral supplements as recommended by your health care provider. What happens during an annual well check? The services and screenings done by your health care provider during your annual well check will depend on your age, overall health, lifestyle risk factors, and family history of disease. Counseling Your health care provider may ask you questions about your:  Alcohol use.  Tobacco use.  Drug use.  Emotional well-being.  Home and relationship well-being.  Sexual activity.  Eating habits.  Work and work Statistician.  Method of birth control.  Menstrual cycle.  Pregnancy history.  Screening You may have the following tests or measurements:  Height, weight, and BMI.  Blood pressure.  Lipid and cholesterol levels. These may be checked every 5 years, or more frequently if you are over 9 years  old.  Skin check.  Lung cancer screening. You may have this screening every year starting at age 26 if you have a 30-pack-year history of smoking and currently smoke or have quit within the past 15 years.  Fecal occult blood test (FOBT) of the stool. You may have this test every year starting at age 80.  Flexible sigmoidoscopy or colonoscopy. You may have a sigmoidoscopy every 5 years or a colonoscopy every 10 years starting at age 37.  Hepatitis C blood test.  Hepatitis B blood test.  Sexually transmitted disease (STD) testing.  Diabetes screening. This is done by checking your blood sugar (glucose) after you have not eaten for a while (fasting). You may have this done every 1-3 years.  Mammogram. This may be done every 1-2 years. Talk to your health care provider about when you should start having regular mammograms. This may depend on whether you have a family history of breast cancer.  BRCA-related cancer screening. This may be done if you have a family history of breast, ovarian, tubal, or peritoneal cancers.  Pelvic exam and Pap test. This may be done every 3 years starting at age 68. Starting at age 14, this may be done every 5 years if you have a Pap test in combination with an HPV test.  Bone density scan. This is done to screen for osteoporosis. You may have this scan if you are at high risk for osteoporosis.  Discuss your test results,  treatment options, and if necessary, the need for more tests with your health care provider. Vaccines Your health care provider may recommend certain vaccines, such as:  Influenza vaccine. This is recommended every year.  Tetanus, diphtheria, and acellular pertussis (Tdap, Td) vaccine. You may need a Td booster every 10 years.  Varicella vaccine. You may need this if you have not been vaccinated.  Zoster vaccine. You may need this after age 7.  Measles, mumps, and rubella (MMR) vaccine. You may need at least one dose of MMR if you were  born in 1957 or later. You may also need a second dose.  Pneumococcal 13-valent conjugate (PCV13) vaccine. You may need this if you have certain conditions and were not previously vaccinated.  Pneumococcal polysaccharide (PPSV23) vaccine. You may need one or two doses if you smoke cigarettes or if you have certain conditions.  Meningococcal vaccine. You may need this if you have certain conditions.  Hepatitis A vaccine. You may need this if you have certain conditions or if you travel or work in places where you may be exposed to hepatitis A.  Hepatitis B vaccine. You may need this if you have certain conditions or if you travel or work in places where you may be exposed to hepatitis B.  Haemophilus influenzae type b (Hib) vaccine. You may need this if you have certain conditions.  Talk to your health care provider about which screenings and vaccines you need and how often you need them. This information is not intended to replace advice given to you by your health care provider. Make sure you discuss any questions you have with your health care provider. Document Released: 12/19/2015 Document Revised: 08/11/2016 Document Reviewed: 09/23/2015 Elsevier Interactive Patient Education  2017 [employer].

## 2017-11-02 ENCOUNTER — Encounter: Payer: Self-pay | Admitting: Internal Medicine

## 2017-11-02 LAB — GAMMA GT: GGT: 34

## 2017-11-02 LAB — CHG IRON BINDING TEST: TIBC: 312

## 2017-11-02 LAB — URIC ACID: URIC ACID: 5.1

## 2017-11-02 LAB — ALBUMIN: ALBUMIN: 4.3

## 2017-11-02 LAB — HIGH SENSITIVITY CRP: CRP: 4

## 2017-11-02 LAB — PROTEIN, TOTAL: TOTAL PROTEIN: 7.2

## 2017-11-02 LAB — FERRITIN: Ferritin: 192

## 2017-11-02 LAB — IRON: Iron: 80

## 2017-11-11 DIAGNOSIS — R768 Other specified abnormal immunological findings in serum: Secondary | ICD-10-CM | POA: Diagnosis not present

## 2017-11-11 DIAGNOSIS — M150 Primary generalized (osteo)arthritis: Secondary | ICD-10-CM | POA: Diagnosis not present

## 2017-11-11 DIAGNOSIS — K760 Fatty (change of) liver, not elsewhere classified: Secondary | ICD-10-CM | POA: Diagnosis not present

## 2018-01-24 ENCOUNTER — Encounter: Payer: Self-pay | Admitting: Internal Medicine

## 2018-01-24 ENCOUNTER — Ambulatory Visit: Payer: BLUE CROSS/BLUE SHIELD | Admitting: Internal Medicine

## 2018-01-24 VITALS — BP 112/80 | HR 73 | Temp 97.8°F | Wt 155.6 lb

## 2018-01-24 DIAGNOSIS — R109 Unspecified abdominal pain: Secondary | ICD-10-CM | POA: Diagnosis not present

## 2018-01-24 DIAGNOSIS — R198 Other specified symptoms and signs involving the digestive system and abdomen: Secondary | ICD-10-CM

## 2018-01-24 LAB — POC URINALSYSI DIPSTICK (AUTOMATED)
Bilirubin, UA: NEGATIVE
Glucose, UA: NEGATIVE
Ketones, UA: NEGATIVE
Leukocytes, UA: NEGATIVE
NITRITE UA: NEGATIVE
PH UA: 6 (ref 5.0–8.0)
Protein, UA: NEGATIVE
RBC UA: NEGATIVE
Spec Grav, UA: 1.01 (ref 1.010–1.025)
UROBILINOGEN UA: 0.2 U/dL

## 2018-01-24 NOTE — Patient Instructions (Addendum)
This could still be a  Stomach infection   That .   If getting a localized painful area we could do more evaluation blood work  and scanning.   Let us know if fever  Localizing pain worse or not starting to get better before the weekend .

## 2018-01-24 NOTE — Progress Notes (Signed)
Chief Complaint  Patient presents with  . GI Problem    started last thursday while eating lunch, loose stools, nausea/no vomiting, cramping , no appitite, no fever, no blood in stools,     HPI: Valerie Mcdowell 52 y.o. come in for  sda  For   Stomach issues since last week  About 5 days ago   Onset after eating  Spinach  Dish  Cooking class    Since 5 days bloated cramping dec appetite .  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 Still appetite .    Emergency c  Zinc.  Had cologuard last year  No go disease in familu x sone has CF  Past Medical History:  Diagnosis Date  . GERD (gastroesophageal reflux disease)     Family History  Problem Relation Age of Onset  . Diabetes Unknown        grandparents  . Hypertension Unknown        grandparents and other blood relative  . Heart disease Unknown        grandparents  . Neuropathy Mother   . Sjogren's syndrome Mother     Social History   Socioeconomic History  . Marital status: Married    Spouse name: Not on file  . Number of children: Not on file  . Years of education: Not on file  . Highest education level: Not on file  Social Needs  . Financial resource strain: Not on file  . Food insecurity - worry: Not on file  . Food insecurity - inability: Not on file  . Transportation needs - medical: Not on file  . Transportation needs - non-medical: Not on file  Occupational History  . Not on file  Tobacco Use  . Smoking status: Never Smoker  . Smokeless tobacco: Never Used  Substance and Sexual Activity  . Alcohol use: Yes    Comment: Occ.  . Drug use: No  . Sexual activity: Not on file  Other Topics Concern  . Not on file  Social History Narrative   Occupation:  Psychologist, sport and exercise clinical lead person    Married   HH of 4   6-8 hours of sleep   Neg tobacco    etoh 5 per week.   caffene   No sugars  Tea.     Outpatient Medications Prior to Visit  Medication Sig Dispense Refill  . B Complex-C (SUPER B COMPLEX PO) Take by mouth.    . Calcium Carbonate-Vitamin D (OSCAL 500/200 D-3 PO) Take by mouth.    . Nutritional Supplements (JUICE PLUS FIBRE PO) Take by mouth.     No facility-administered medications prior to visit.      EXAM:  BP 112/80 (BP Location: Right Arm, Patient Position: Sitting, Cuff Size: Normal)   Pulse 73   Temp 97.8 F (36.6 C) (Oral)   Wt 155 lb 9.6 oz (70.6 kg)   LMP 12/06/2014 (Within Days)   BMI 25.31 kg/m   Body mass index is 25.31 kg/m.  GENERAL: vitals reviewed and listed above, alert, oriented, appears well hydrated and in no acute distress no icteric  HEENT:  atraumatic, conjunctiva  clear, no obvious abnormalities on inspection of external nose and ears  NECK: no obvious masses on inspection palpation  LUNGS: clear to auscultation bilaterally, no wheezes, rales or rhonchi, good air movement CV: HRRR, no clubbing cyanosis or  peripheral edema nl cap refill  Abdomen:  Sof,t normal bowel sounds without hepatosplenomegaly, no guarding rebound mild  soreness described  Non focal  or masses no CVA tenderness MS: moves all extremities without noticeable focal  abnormality PSYCH: pleasant and cooperative, no obvious depression or anxiety  BP Readings from Last 3 Encounters:  01/24/18 112/80  11/01/17 110/78  09/13/16 114/76   Wt Readings from Last 3 Encounters:  01/24/18 155 lb 9.6 oz (70.6 kg)  11/01/17 160 lb 12.8 oz (72.9 kg)  09/13/16 158 lb 12.8 oz (72 kg)   poct ua clear   ASSESSMENT AND PLAN:  Discussed the following assessment and plan:  Abdominal cramping - Plan: POCT Urinalysis Dipstick (Automated)  Change in bowel function - Plan: POCT Urinalysis Dipstick (Automated)  GI  symptom Change in bowel habits 5 days and  Gi sx   Pos GE based on context  And disc  Poss diff dx not  Obvious   No alarm findings today  No obs sx  But  if  persistent or progressive or pain fever then reeval  Disc sx of diverticulitis   And other  -Patient advised to return or notify health care team  if  new concerns arise.  Patient Instructions  This could still be a  Stomach infection   That .   If getting a localized painful area we could do more evaluation blood work  and scanning.   Let us know if fever  Localizing pain worse or not starting to get better before the weekend .        Neta MendsWanda K. Freddye Cardamone M.D.

## 2018-03-02 DIAGNOSIS — J309 Allergic rhinitis, unspecified: Secondary | ICD-10-CM | POA: Diagnosis not present

## 2018-03-02 DIAGNOSIS — H6501 Acute serous otitis media, right ear: Secondary | ICD-10-CM | POA: Diagnosis not present

## 2018-03-02 DIAGNOSIS — R42 Dizziness and giddiness: Secondary | ICD-10-CM | POA: Diagnosis not present

## 2018-03-19 DIAGNOSIS — J101 Influenza due to other identified influenza virus with other respiratory manifestations: Secondary | ICD-10-CM | POA: Diagnosis not present

## 2018-06-07 ENCOUNTER — Other Ambulatory Visit: Payer: Self-pay | Admitting: Obstetrics and Gynecology

## 2018-06-07 DIAGNOSIS — Z1231 Encounter for screening mammogram for malignant neoplasm of breast: Secondary | ICD-10-CM

## 2018-07-03 ENCOUNTER — Ambulatory Visit
Admission: RE | Admit: 2018-07-03 | Discharge: 2018-07-03 | Disposition: A | Discharge status: Home / Self Care | Payer: BLUE CROSS/BLUE SHIELD | Source: Ambulatory Visit | Attending: Obstetrics and Gynecology | Admitting: Obstetrics and Gynecology

## 2018-07-03 DIAGNOSIS — Z1231 Encounter for screening mammogram for malignant neoplasm of breast: Secondary | ICD-10-CM | POA: Diagnosis not present

## 2018-08-25 ENCOUNTER — Encounter: Payer: Self-pay | Admitting: Internal Medicine

## 2018-08-25 DIAGNOSIS — Z6826 Body mass index (BMI) 26.0-26.9, adult: Secondary | ICD-10-CM | POA: Diagnosis not present

## 2018-08-25 DIAGNOSIS — Z01419 Encounter for gynecological examination (general) (routine) without abnormal findings: Secondary | ICD-10-CM | POA: Diagnosis not present

## 2018-11-01 NOTE — Progress Notes (Signed)
Chief Complaint  Patient presents with  . Annual Exam    no new concerns    HPI: Patient  Valerie Mcdowell  52 y.o. comes in today for Preventive Health Care visit   Dr Vinetta Bergamo gyne did pap and   utd on mammo and   Did   cologuard   For 3 years .  Rheum on a prn basis not felt to have  Active  disease    Pos antibody but no disease  ( mom sjogrens) Pos  antiibodies but no disease    Liver tests wer better but still off .   Was supposed to be here for  Discussed  Not longer seeing rheum.    To have  2 grandchildren and  Needs tdap updated as needed Getting stuffy nose at night  Recently  Using saline  Health Maintenance  Topic Date Due  . HIV Screening  02/25/1980  . COLONOSCOPY  02/25/2015  . MAMMOGRAM  07/04/2019  . PAP SMEAR  08/25/2021  . TETANUS/TDAP  10/25/2023  . INFLUENZA VACCINE  Completed   Health Maintenance Review LIFESTYLE:  Exercise:    Trying to walk  Tobacco/ETS: no Alcohol:  ocass Sugar beverages:   no Sleep:  6-7 hours  Drug use: no HH of 2  Work: 34     ROS:  GEN/ HEENT: No fever, significant weight changes sweats headaches vision problems hearing changes, CV/ PULM; No chest pain shortness of breath cough, syncope,edema  change in exercise tolerance. GI /GU: No adominal pain, vomiting, change in bowel habits. No blood in the stool. No significant GU symptoms. SKIN/HEME: ,no acute skin rashes suspicious lesions or bleeding. No lymphadenopathy, nodules, masses.  NEURO/ PSYCH:  No neurologic signs such as weakness numbness. No depression anxiety. IMM/ Allergy: No unusual infections.  Allergy .   REST of 12 system review negative except as per HPI   Past Medical History:  Diagnosis Date  . GERD (gastroesophageal reflux disease)     Past Surgical History:  Procedure Laterality Date  . NO PAST SURGERIES      Family History  Problem Relation Age of Onset  . Diabetes Unknown        grandparents  . Hypertension Unknown        grandparents and  other blood relative  . Heart disease Unknown        grandparents  . Neuropathy Mother   . Sjogren's syndrome Mother   . Cystic fibrosis Son     Social History   Socioeconomic History  . Marital status: Married    Spouse name: Not on file  . Number of children: Not on file  . Years of education: Not on file  . Highest education level: Not on file  Occupational History  . Not on file  Social Needs  . Financial resource strain: Not on file  . Food insecurity:    Worry: Not on file    Inability: Not on file  . Transportation needs:    Medical: Not on file    Non-medical: Not on file  Tobacco Use  . Smoking status: Never Smoker  . Smokeless tobacco: Never Used  Substance and Sexual Activity  . Alcohol use: Yes    Comment: Occ.  . Drug use: No  . Sexual activity: Not on file  Lifestyle  . Physical activity:    Days per week: Not on file    Minutes per session: Not on file  . Stress: Not on file  Relationships  . Social connections:    Talks on phone: Not on file    Gets together: Not on file    Attends religious service: Not on file    Active member of club or organization: Not on file    Attends meetings of clubs or organizations: Not on file    Relationship status: Not on file  Other Topics Concern  . Not on file  Social History Narrative   Occupation: Arts administrator clinical lead person    Married   [place] of 4   6-8 hours of sleep   Neg tobacco    etoh 5 per week.   caffene   No sugars  Tea.     Outpatient Medications Prior to Visit  Medication Sig Dispense Refill  . B Complex-C (SUPER B COMPLEX PO) Take by mouth.    . Calcium Carbonate-Vitamin D (OSCAL 500/200 D-3 PO) Take by mouth.    . Nutritional Supplements (JUICE PLUS FIBRE PO) Take by mouth.     No facility-administered medications prior to visit.      EXAM:  BP 102/68 (BP Location: Right Arm, Patient Position: Sitting, Cuff Size: Normal)   Pulse 84   Temp 98.1 F (36.7 C) (Oral)    Ht 5' 5.35" (1.66 m)   Wt 153 lb 3.2 oz (69.5 kg)   LMP 12/06/2014 (Within Days)   BMI 25.22 kg/m   Body mass index is 25.22 kg/m. Wt Readings from Last 3 Encounters:  11/06/18 153 lb 3.2 oz (69.5 kg)  01/24/18 155 lb 9.6 oz (70.6 kg)  11/01/17 160 lb 12.8 oz (72.9 kg)    Physical Exam: Vital signs reviewed JXB:JYNW is a well-developed well-nourished alert cooperative    who appearsr stated age in no acute distress.  HEENT: normocephalic atraumatic , Eyes: PERRL EOM's full, conjunctiva clear, Nares: paten,t no deformity discharge or tenderness stuffy ., Ears: no deformity EAC's clear TMs with normal landmarks. Mouth: clear OP, no lesions, edema.  Moist mucous membranes. Dentition in adequate repair. NECK: supple without masses, thyromegaly or bruits. CHEST/PULM:  Clear to auscultation and percussion breath sounds equal no wheeze , rales or rhonchi. No chest wall deformities or tenderness. Breast: per gyne . CV: PMI is nondisplaced, S1 S2 no gallops, murmurs, rubs. Peripheral pulses are full without delay.No JVD .  ABDOMEN: Bowel sounds normal nontender  No guard or rebound, no hepato splenomegal no CVA tenderness.   Extremtities:  No clubbing cyanosis or edema, no acute joint swelling or redness no focal atrophy NEURO:  Oriented x3, cranial nerves 3-12 appear to be intact, no obvious focal weakness,gait within normal limits no abnormal reflexes or asymmetrical SKIN: No acute rashes normal turgor, color, no bruising or petechiae. PSYCH: Oriented, good eye contact, no obvious depression anxiety, cognition and judgment appear normal. LN: no cervical  adenopathy  Lab Results  Component Value Date   HGB 12.6 08/27/2011   CHOL 151 09/02/2017   TRIG 107 09/02/2017   HDL 39 09/02/2017   LDLCALC 92 09/02/2017   ALT 64 (A) 08/20/2016   AST 43 (A) 09/02/2017   AST 57 (A) 09/02/2017   CREATININE 0.7 09/02/2017   TSH 2.31 09/02/2017   HGBA1C 5.3 09/02/2017    BP Readings from Last 3  Encounters:  11/06/18 102/68  01/24/18 112/80  11/01/17 110/78      ASSESSMENT AND PLAN:  Discussed the following assessment and plan:  Visit for preventive health examination  Positive ANA (antinuclear antibody)  Abnormal LFTs -  [place] prob fatty liver  by US   Hepatic steatosis  by ultrasound 1 18 ? About menopause and   hormone levels  Not needed unless a problem to evaluate Patient Care Team: Keeanna Villafranca, Standley Brooking, MD as PCP - Dorothy Spark, MD as Referring Physician (Obstetrics and Gynecology) Druscilla Brownie, MD as Consulting Physician (Dermatology) Gavin Pound, MD as Consulting Physician (Rheumatology) Patient Instructions   Consider   Seeing gi  Specialist for opinion on  Any other intervention or follow up of the abnormal  Liver tests  Felt to be from possible  Fatty liver.    Continue lifestyle intervention healthy eating and  Add  Bone building exercise    Immunization History  Administered Date(s) Administered  . Influenza Split 08/22/2013  . Influenza,inj,Quad PF,6+ Mos 09/12/2015  . PPD Test 08/22/2013  . Tdap 10/24/2013       Preventive Care 40-64 Years, Female Preventive care refers to lifestyle choices and visits with your health care provider that can promote health and wellness. What does preventive care include?  A yearly physical exam. This is also called an annual well check.  Dental exams once or twice a year.  Routine eye exams. Ask your health care provider how often you should have your eyes checked.  Personal lifestyle choices, including: ? Daily care of your teeth and gums. ? Regular physical activity. ? Eating a healthy diet. ? Avoiding tobacco and drug use. ? Limiting alcohol use. ? Practicing safe sex. ? Taking low-dose aspirin daily starting at age 32. ? Taking vitamin and mineral supplements as recommended by your health care provider. What happens during an annual well check? The services and screenings done by  your health care provider during your annual well check will depend on your age, overall health, lifestyle risk factors, and family history of disease. Counseling Your health care provider may ask you questions about your:  Alcohol use.  Tobacco use.  Drug use.  Emotional well-being.  Home and relationship well-being.  Sexual activity.  Eating habits.  Work and work Statistician.  Method of birth control.  Menstrual cycle.  Pregnancy history.  Screening You may have the following tests or measurements:  Height, weight, and BMI.  Blood pressure.  Lipid and cholesterol levels. These may be checked every 5 years, or more frequently if you are over 81 years old.  Skin check.  Lung cancer screening. You may have this screening every year starting at age 10 if you have a 30-pack-year history of smoking and currently smoke or have quit within the past 15 years.  Fecal occult blood test (FOBT) of the stool. You may have this test every year starting at age 74.  Flexible sigmoidoscopy or colonoscopy. You may have a sigmoidoscopy every 5 years or a colonoscopy every 10 years starting at age 26.  Hepatitis C blood test.  Hepatitis B blood test.  Sexually transmitted disease (STD) testing.  Diabetes screening. This is done by checking your blood sugar (glucose) after you have not eaten for a while (fasting). You may have this done every 1-3 years.  Mammogram. This may be done every 1-2 years. Talk to your health care provider about when you should start having regular mammograms. This may depend on whether you have a family history of breast cancer.  BRCA-related cancer screening. This may be done if you have a family history of breast, ovarian, tubal, or peritoneal cancers.  Pelvic exam and Pap test. This may be done every 3 years  starting at age 54. Starting at age 51, this may be done every 5 years if you have a Pap test in combination with an HPV test.  Bone density  scan. This is done to screen for osteoporosis. You may have this scan if you are at high risk for osteoporosis.  Discuss your test results, treatment options, and if necessary, the need for more tests with your health care provider. Vaccines Your health care provider may recommend certain vaccines, such as:  Influenza vaccine. This is recommended every year.  Tetanus, diphtheria, and acellular pertussis (Tdap, Td) vaccine. You may need a Td booster every 10 years.  Varicella vaccine. You may need this if you have not been vaccinated.  Zoster vaccine. You may need this after age 33.  Measles, mumps, and rubella (MMR) vaccine. You may need at least one dose of MMR if you were born in 1957 or later. You may also need a second dose.  Pneumococcal 13-valent conjugate (PCV13) vaccine. You may need this if you have certain conditions and were not previously vaccinated.  Pneumococcal polysaccharide (PPSV23) vaccine. You may need one or two doses if you smoke cigarettes or if you have certain conditions.  Meningococcal vaccine. You may need this if you have certain conditions.  Hepatitis A vaccine. You may need this if you have certain conditions or if you travel or work in places where you may be exposed to hepatitis A.  Hepatitis B vaccine. You may need this if you have certain conditions or if you travel or work in places where you may be exposed to hepatitis B.  Haemophilus influenzae type b (Hib) vaccine. You may need this if you have certain conditions.  Talk to your health care provider about which screenings and vaccines you need and how often you need them. This information is not intended to replace advice given to you by your health care provider. Make sure you discuss any questions you have with your health care provider. Document Released: 12/19/2015 Document Revised: 08/11/2016 Document Reviewed: 09/23/2015 Elsevier Interactive Patient Education  2018 [place]. Armoni Kludt M.D.

## 2018-11-06 ENCOUNTER — Encounter: Payer: Self-pay | Admitting: Internal Medicine

## 2018-11-06 ENCOUNTER — Ambulatory Visit (INDEPENDENT_AMBULATORY_CARE_PROVIDER_SITE_OTHER): Payer: BLUE CROSS/BLUE SHIELD | Admitting: Internal Medicine

## 2018-11-06 VITALS — BP 102/68 | HR 84 | Temp 98.1°F | Ht 65.35 in | Wt 153.2 lb

## 2018-11-06 DIAGNOSIS — Z0000 Encounter for general adult medical examination without abnormal findings: Secondary | ICD-10-CM | POA: Insufficient documentation

## 2018-11-06 DIAGNOSIS — K760 Fatty (change of) liver, not elsewhere classified: Secondary | ICD-10-CM

## 2018-11-06 DIAGNOSIS — R768 Other specified abnormal immunological findings in serum: Secondary | ICD-10-CM | POA: Diagnosis not present

## 2018-11-06 DIAGNOSIS — R945 Abnormal results of liver function studies: Secondary | ICD-10-CM

## 2018-11-06 DIAGNOSIS — R7989 Other specified abnormal findings of blood chemistry: Secondary | ICD-10-CM | POA: Insufficient documentation

## 2018-11-06 NOTE — Patient Instructions (Addendum)
Consider   Seeing gi  Specialist for opinion on  Any other intervention or follow up of the abnormal  Liver tests  Felt to be from possible  Fatty liver.    Continue lifestyle intervention healthy eating and  Add  Bone building exercise    Immunization History  Administered Date(s) Administered  . Influenza Split 08/22/2013  . Influenza,inj,Quad PF,6+ Mos 09/12/2015  . PPD Test 08/22/2013  . Tdap 10/24/2013       Preventive Care 40-64 Years, Female Preventive care refers to lifestyle choices and visits with your health care provider that can promote health and wellness. What does preventive care include?  A yearly physical exam. This is also called an annual well check.  Dental exams once or twice a year.  Routine eye exams. Ask your health care provider how often you should have your eyes checked.  Personal lifestyle choices, including: ? Daily care of your teeth and gums. ? Regular physical activity. ? Eating a healthy diet. ? Avoiding tobacco and drug use. ? Limiting alcohol use. ? Practicing safe sex. ? Taking low-dose aspirin daily starting at age 74. ? Taking vitamin and mineral supplements as recommended by your health care provider. What happens during an annual well check? The services and screenings done by your health care provider during your annual well check will depend on your age, overall health, lifestyle risk factors, and family history of disease. Counseling Your health care provider may ask you questions about your:  Alcohol use.  Tobacco use.  Drug use.  Emotional well-being.  Home and relationship well-being.  Sexual activity.  Eating habits.  Work and work Statistician.  Method of birth control.  Menstrual cycle.  Pregnancy history.  Screening You may have the following tests or measurements:  Height, weight, and BMI.  Blood pressure.  Lipid and cholesterol levels. These may be checked every 5 years, or more frequently if you  are over 80 years old.  Skin check.  Lung cancer screening. You may have this screening every year starting at age 74 if you have a 30-pack-year history of smoking and currently smoke or have quit within the past 15 years.  Fecal occult blood test (FOBT) of the stool. You may have this test every year starting at age 31.  Flexible sigmoidoscopy or colonoscopy. You may have a sigmoidoscopy every 5 years or a colonoscopy every 10 years starting at age 46.  Hepatitis C blood test.  Hepatitis B blood test.  Sexually transmitted disease (STD) testing.  Diabetes screening. This is done by checking your blood sugar (glucose) after you have not eaten for a while (fasting). You may have this done every 1-3 years.  Mammogram. This may be done every 1-2 years. Talk to your health care provider about when you should start having regular mammograms. This may depend on whether you have a family history of breast cancer.  BRCA-related cancer screening. This may be done if you have a family history of breast, ovarian, tubal, or peritoneal cancers.  Pelvic exam and Pap test. This may be done every 3 years starting at age 77. Starting at age 80, this may be done every 5 years if you have a Pap test in combination with an HPV test.  Bone density scan. This is done to screen for osteoporosis. You may have this scan if you are at high risk for osteoporosis.  Discuss your test results, treatment options, and if necessary, the need for more tests with your health care  provider. Vaccines Your health care provider may recommend certain vaccines, such as:  Influenza vaccine. This is recommended every year.  Tetanus, diphtheria, and acellular pertussis (Tdap, Td) vaccine. You may need a Td booster every 10 years.  Varicella vaccine. You may need this if you have not been vaccinated.  Zoster vaccine. You may need this after age 60.  Measles, mumps, and rubella (MMR) vaccine. You may need at least one dose  of MMR if you were born in 1957 or later. You may also need a second dose.  Pneumococcal 13-valent conjugate (PCV13) vaccine. You may need this if you have certain conditions and were not previously vaccinated.  Pneumococcal polysaccharide (PPSV23) vaccine. You may need one or two doses if you smoke cigarettes or if you have certain conditions.  Meningococcal vaccine. You may need this if you have certain conditions.  Hepatitis A vaccine. You may need this if you have certain conditions or if you travel or work in places where you may be exposed to hepatitis A.  Hepatitis B vaccine. You may need this if you have certain conditions or if you travel or work in places where you may be exposed to hepatitis B.  Haemophilus influenzae type b (Hib) vaccine. You may need this if you have certain conditions.  Talk to your health care provider about which screenings and vaccines you need and how often you need them. This information is not intended to replace advice given to you by your health care provider. Make sure you discuss any questions you have with your health care provider. Document Released: 12/19/2015 Document Revised: 08/11/2016 Document Reviewed: 09/23/2015 Elsevier Interactive Patient Education  2018 [employer] Inc[employer]    

## 2018-11-07 DIAGNOSIS — R350 Frequency of micturition: Secondary | ICD-10-CM | POA: Diagnosis not present

## 2018-12-09 DIAGNOSIS — N390 Urinary tract infection, site not specified: Secondary | ICD-10-CM | POA: Diagnosis not present

## 2018-12-15 ENCOUNTER — Telehealth: Payer: Self-pay | Admitting: Internal Medicine

## 2018-12-15 DIAGNOSIS — R945 Abnormal results of liver function studies: Secondary | ICD-10-CM

## 2018-12-15 DIAGNOSIS — R7989 Other specified abnormal findings of blood chemistry: Secondary | ICD-10-CM

## 2018-12-15 DIAGNOSIS — R198 Other specified symptoms and signs involving the digestive system and abdomen: Secondary | ICD-10-CM

## 2018-12-15 NOTE — Telephone Encounter (Signed)
Pt has been notified and referral has been placed.

## 2018-12-15 NOTE — Telephone Encounter (Signed)
Sincere Apologies that  haven't gotten back with you   In a timely manner .     Tests results of liver seem about the same as last time tested . Mild abnormalities   I reviewed record and last US showed probably from fatty liver .    intervention is usually  Intensify lifestyle interventions.   But lots of research in this area .  I suggest We can get an  udated opinion from gastroenterology as to any thing to do further at this time   .  I dont think you have   Autoimmune liver disease but they can reassess for that also .   Please let us know  If you have a preferred  Gi or  one you may have seen in past   That you prefer and we can do a referral for opinion.

## 2018-12-15 NOTE — Telephone Encounter (Signed)
Called pt  Back and informed her that Dr.Panosh has not gone over the results yet but I will let dr.panosh know they are in there and to review them

## 2018-12-15 NOTE — Telephone Encounter (Signed)
Lvm for patient to call back crm created

## 2018-12-15 NOTE — Telephone Encounter (Signed)
Copied from CRM [id number]. Topic: General - Other >> Dec 15, 2018 10:04 AM Tamela Oddi wrote: Reason for CRM: Patient called to speak with the nurse or doctor regarding her blood work.  Patient stated that she hand delivered it over a week ago and has not heard anything back from the doctor.  Please advise.  CB# [phone]

## 2018-12-25 DIAGNOSIS — R309 Painful micturition, unspecified: Secondary | ICD-10-CM | POA: Diagnosis not present

## 2018-12-25 DIAGNOSIS — R310 Gross hematuria: Secondary | ICD-10-CM | POA: Diagnosis not present

## 2018-12-25 DIAGNOSIS — R300 Dysuria: Secondary | ICD-10-CM | POA: Diagnosis not present

## 2018-12-26 ENCOUNTER — Telehealth: Payer: Self-pay | Admitting: Internal Medicine

## 2018-12-26 ENCOUNTER — Encounter: Payer: Self-pay | Admitting: Internal Medicine

## 2018-12-26 NOTE — Telephone Encounter (Signed)
Copied from CRM [id number]. Topic: Quick Communication - See Telephone Encounter >> Dec 26, 2018  9:24 AM Arlyss Gandy, NT wrote: CRM for notification. See Telephone encounter for: 12/26/18. Pt would like to speak with Dr. Fabian Sharp regarding her referrals to GI and to a Gyn. She has some questions regarding these appts.

## 2018-12-27 NOTE — Telephone Encounter (Signed)
Pt states that she has been having UTIs since December 3rd - x3 Pt is having a CT Urogram this Friday Pt states that she asked them if the CT Urogram will show her liver so that she will not have to have multiple scans done. Pt states that her GYN told her that her liver "is usually" included in the CT scan.   Will this we okay?   Pt cannot be seen by GI until end of Feb (not sure if they would want to do a u/s or a CT scan) and they were not able to tell her if the Urogram would be an adequate test for her given what she was referred for by Dr Fabian Sharp.   Please advise Dr Fabian Sharp, thanks.

## 2018-12-27 NOTE — Telephone Encounter (Signed)
LM for call back

## 2018-12-27 NOTE — Telephone Encounter (Signed)
Pt states needs to speak to Ashtyn or Dr Fabian Sharp directly regarding her referral.    [phone]

## 2018-12-27 NOTE — Telephone Encounter (Signed)
I agree  Would  Ask radiology to    Make  Special  comment on the liver when they interpret the scan .

## 2018-12-29 ENCOUNTER — Encounter: Payer: Self-pay | Admitting: Internal Medicine

## 2018-12-29 DIAGNOSIS — R310 Gross hematuria: Secondary | ICD-10-CM | POA: Diagnosis not present

## 2018-12-29 DIAGNOSIS — N200 Calculus of kidney: Secondary | ICD-10-CM | POA: Diagnosis not present

## 2018-12-29 NOTE — Telephone Encounter (Signed)
Pt would like to speak to Dr Fabian SharpPanosh personally.  Pt is having a procedure at 12:30 today and requesting to speak with her this morning, prior to that. Pt states she does not want to speak to a nurse. She was very adamant about that.  Pt states she has called and no one has called her back.

## 2019-01-01 NOTE — Telephone Encounter (Signed)
Spoke with patient on 12/29/2018, aware that Dr Fabian Sharp agrees with this test. Pt aware that the Radiologist just needs to "comment on the liver" and there is no extra testing needed.  Pt will call or have the Radiologist call is any issues during the test. Nothing further needed.

## 2019-01-03 NOTE — Telephone Encounter (Signed)
I dont see it  Please see if has been sent over from urology

## 2019-01-05 NOTE — Telephone Encounter (Signed)
The report  States liver  Has steatosis ( fatty inflitration)  No lesions  So  Advise seeing GI for opinion about  Any other interventions ( in addition to heart healthy eating  Avoiding sugars  Processed and animal fats )   I think a Gi referral has already been made but please make sure this is in the works  Will send document to scan

## 2019-01-08 DIAGNOSIS — N200 Calculus of kidney: Secondary | ICD-10-CM | POA: Diagnosis not present

## 2019-01-09 DIAGNOSIS — K1123 Chronic sialoadenitis: Secondary | ICD-10-CM | POA: Diagnosis not present

## 2019-01-17 ENCOUNTER — Encounter: Payer: Self-pay | Admitting: Internal Medicine

## 2019-01-25 ENCOUNTER — Encounter

## 2019-01-25 ENCOUNTER — Ambulatory Visit: Payer: BLUE CROSS/BLUE SHIELD | Admitting: Internal Medicine

## 2019-02-12 DIAGNOSIS — R768 Other specified abnormal immunological findings in serum: Secondary | ICD-10-CM | POA: Diagnosis not present

## 2019-02-12 DIAGNOSIS — M150 Primary generalized (osteo)arthritis: Secondary | ICD-10-CM | POA: Diagnosis not present

## 2019-02-12 DIAGNOSIS — M2550 Pain in unspecified joint: Secondary | ICD-10-CM | POA: Diagnosis not present

## 2019-04-02 DIAGNOSIS — E639 Nutritional deficiency, unspecified: Secondary | ICD-10-CM | POA: Diagnosis not present

## 2019-04-02 DIAGNOSIS — M3500 Sicca syndrome, unspecified: Secondary | ICD-10-CM | POA: Diagnosis not present

## 2019-04-02 DIAGNOSIS — E559 Vitamin D deficiency, unspecified: Secondary | ICD-10-CM | POA: Diagnosis not present

## 2019-04-02 DIAGNOSIS — N951 Menopausal and female climacteric states: Secondary | ICD-10-CM | POA: Diagnosis not present

## 2019-04-02 DIAGNOSIS — N390 Urinary tract infection, site not specified: Secondary | ICD-10-CM | POA: Diagnosis not present

## 2019-04-27 DIAGNOSIS — E639 Nutritional deficiency, unspecified: Secondary | ICD-10-CM | POA: Diagnosis not present

## 2019-04-27 DIAGNOSIS — N951 Menopausal and female climacteric states: Secondary | ICD-10-CM | POA: Diagnosis not present

## 2019-04-27 DIAGNOSIS — N390 Urinary tract infection, site not specified: Secondary | ICD-10-CM | POA: Diagnosis not present

## 2019-04-27 DIAGNOSIS — M3500 Sicca syndrome, unspecified: Secondary | ICD-10-CM | POA: Diagnosis not present

## 2019-06-05 ENCOUNTER — Other Ambulatory Visit: Payer: Self-pay | Admitting: Obstetrics and Gynecology

## 2019-06-05 DIAGNOSIS — Z1231 Encounter for screening mammogram for malignant neoplasm of breast: Secondary | ICD-10-CM

## 2019-07-13 ENCOUNTER — Ambulatory Visit: Payer: BLUE CROSS/BLUE SHIELD

## 2019-07-13 ENCOUNTER — Ambulatory Visit
Admission: RE | Admit: 2019-07-13 | Discharge: 2019-07-13 | Disposition: A | Discharge status: Home / Self Care | Payer: BC Managed Care – PPO | Source: Ambulatory Visit | Attending: Obstetrics and Gynecology | Admitting: Obstetrics and Gynecology

## 2019-07-13 ENCOUNTER — Other Ambulatory Visit: Payer: Self-pay

## 2019-07-13 ENCOUNTER — Ambulatory Visit: Payer: BC Managed Care – PPO

## 2019-07-13 DIAGNOSIS — Z1231 Encounter for screening mammogram for malignant neoplasm of breast: Secondary | ICD-10-CM | POA: Diagnosis not present

## 2019-07-20 ENCOUNTER — Ambulatory Visit: Payer: BC Managed Care – PPO

## 2019-07-20 ENCOUNTER — Ambulatory Visit: Payer: BLUE CROSS/BLUE SHIELD

## 2019-07-23 DIAGNOSIS — M9901 Segmental and somatic dysfunction of cervical region: Secondary | ICD-10-CM | POA: Diagnosis not present

## 2019-07-26 DIAGNOSIS — M9901 Segmental and somatic dysfunction of cervical region: Secondary | ICD-10-CM | POA: Diagnosis not present

## 2019-08-10 DIAGNOSIS — R779 Abnormality of plasma protein, unspecified: Secondary | ICD-10-CM | POA: Diagnosis not present

## 2019-08-10 DIAGNOSIS — K1120 Sialoadenitis, unspecified: Secondary | ICD-10-CM | POA: Diagnosis not present

## 2019-08-10 DIAGNOSIS — R769 Abnormal immunological finding in serum, unspecified: Secondary | ICD-10-CM | POA: Diagnosis not present

## 2019-08-10 DIAGNOSIS — M3500 Sicca syndrome, unspecified: Secondary | ICD-10-CM | POA: Diagnosis not present

## 2019-08-10 DIAGNOSIS — M9901 Segmental and somatic dysfunction of cervical region: Secondary | ICD-10-CM | POA: Diagnosis not present

## 2019-09-03 DIAGNOSIS — M9901 Segmental and somatic dysfunction of cervical region: Secondary | ICD-10-CM | POA: Diagnosis not present

## 2019-09-07 DIAGNOSIS — Z6824 Body mass index (BMI) 24.0-24.9, adult: Secondary | ICD-10-CM | POA: Diagnosis not present

## 2019-09-07 DIAGNOSIS — Z01419 Encounter for gynecological examination (general) (routine) without abnormal findings: Secondary | ICD-10-CM | POA: Diagnosis not present

## 2019-09-14 DIAGNOSIS — M3500 Sicca syndrome, unspecified: Secondary | ICD-10-CM | POA: Diagnosis not present

## 2019-10-12 DIAGNOSIS — M9901 Segmental and somatic dysfunction of cervical region: Secondary | ICD-10-CM | POA: Diagnosis not present

## 2019-11-02 DIAGNOSIS — M9901 Segmental and somatic dysfunction of cervical region: Secondary | ICD-10-CM | POA: Diagnosis not present

## 2019-11-09 DIAGNOSIS — M9901 Segmental and somatic dysfunction of cervical region: Secondary | ICD-10-CM | POA: Diagnosis not present

## 2019-11-21 DIAGNOSIS — D1801 Hemangioma of skin and subcutaneous tissue: Secondary | ICD-10-CM | POA: Diagnosis not present

## 2019-11-21 DIAGNOSIS — L812 Freckles: Secondary | ICD-10-CM | POA: Diagnosis not present

## 2019-11-21 DIAGNOSIS — D229 Melanocytic nevi, unspecified: Secondary | ICD-10-CM | POA: Diagnosis not present

## 2019-11-21 DIAGNOSIS — L814 Other melanin hyperpigmentation: Secondary | ICD-10-CM | POA: Diagnosis not present

## 2019-11-26 DIAGNOSIS — M9901 Segmental and somatic dysfunction of cervical region: Secondary | ICD-10-CM | POA: Diagnosis not present
